# Patient Record
Sex: Female | Born: 1935 | Race: White | Hispanic: No | Marital: Married | Smoking: Former smoker
Health system: Southern US, Community
[De-identification: ages and names within clinical notes are randomized; demographics above are authoritative.]

## PROBLEM LIST (undated history)

## (undated) DIAGNOSIS — I10 Essential (primary) hypertension: Secondary | ICD-10-CM

## (undated) DIAGNOSIS — M1990 Unspecified osteoarthritis, unspecified site: Secondary | ICD-10-CM

## (undated) DIAGNOSIS — M810 Age-related osteoporosis without current pathological fracture: Secondary | ICD-10-CM

## (undated) DIAGNOSIS — I2699 Other pulmonary embolism without acute cor pulmonale: Secondary | ICD-10-CM

## (undated) DIAGNOSIS — H3530 Unspecified macular degeneration: Secondary | ICD-10-CM

## (undated) DIAGNOSIS — I517 Cardiomegaly: Secondary | ICD-10-CM

## (undated) DIAGNOSIS — E7800 Pure hypercholesterolemia, unspecified: Secondary | ICD-10-CM

## (undated) HISTORY — DX: Age-related osteoporosis without current pathological fracture: M81.0

## (undated) HISTORY — DX: Other pulmonary embolism without acute cor pulmonale: I26.99

## (undated) HISTORY — PX: VEIN LIGATION: SHX2652

## (undated) HISTORY — DX: Unspecified macular degeneration: H35.30

## (undated) HISTORY — DX: Pure hypercholesterolemia, unspecified: E78.00

## (undated) HISTORY — DX: Unspecified osteoarthritis, unspecified site: M19.90

## (undated) HISTORY — DX: Cardiomegaly: I51.7

## (undated) HISTORY — DX: Essential (primary) hypertension: I10

## (undated) HISTORY — PX: OTHER SURGICAL HISTORY: SHX169

## (undated) HISTORY — PX: REPLACEMENT TOTAL KNEE: SUR1224

---

## 1976-07-27 HISTORY — PX: VAGINAL HYSTERECTOMY: SUR661

## 2000-07-27 HISTORY — PX: OTHER SURGICAL HISTORY: SHX169

## 2001-07-27 HISTORY — PX: CHOLECYSTECTOMY: SHX55

## 2002-07-27 HISTORY — PX: HERNIA REPAIR: SHX51

## 2004-08-28 ENCOUNTER — Ambulatory Visit: Payer: Self-pay | Admitting: Internal Medicine

## 2005-05-12 ENCOUNTER — Ambulatory Visit: Payer: Self-pay | Admitting: Internal Medicine

## 2005-09-15 ENCOUNTER — Ambulatory Visit: Payer: Self-pay | Admitting: Internal Medicine

## 2006-04-14 ENCOUNTER — Ambulatory Visit: Payer: Self-pay | Admitting: Unknown Physician Specialty

## 2006-04-16 ENCOUNTER — Ambulatory Visit: Payer: Self-pay | Admitting: Internal Medicine

## 2006-04-26 ENCOUNTER — Ambulatory Visit: Payer: Self-pay | Admitting: Internal Medicine

## 2006-05-15 ENCOUNTER — Emergency Department: Payer: Self-pay | Admitting: Emergency Medicine

## 2006-05-21 ENCOUNTER — Inpatient Hospital Stay: Payer: Self-pay | Admitting: Internal Medicine

## 2006-09-30 ENCOUNTER — Ambulatory Visit: Payer: Self-pay | Admitting: Internal Medicine

## 2007-10-05 ENCOUNTER — Ambulatory Visit: Payer: Self-pay | Admitting: Internal Medicine

## 2008-07-25 ENCOUNTER — Emergency Department: Payer: Self-pay | Admitting: Emergency Medicine

## 2008-10-08 ENCOUNTER — Ambulatory Visit: Payer: Self-pay | Admitting: Internal Medicine

## 2009-09-02 ENCOUNTER — Encounter: Admission: RE | Admit: 2009-09-02 | Discharge: 2009-09-02 | Payer: Self-pay | Admitting: Internal Medicine

## 2009-10-09 ENCOUNTER — Ambulatory Visit: Payer: Self-pay | Admitting: Internal Medicine

## 2010-02-25 ENCOUNTER — Encounter: Admission: RE | Admit: 2010-02-25 | Discharge: 2010-02-25 | Payer: Self-pay | Admitting: Neurosurgery

## 2010-06-09 ENCOUNTER — Ambulatory Visit: Payer: Self-pay | Admitting: General Practice

## 2010-06-23 ENCOUNTER — Ambulatory Visit: Payer: Self-pay | Admitting: Vascular Surgery

## 2010-06-25 ENCOUNTER — Inpatient Hospital Stay: Payer: Self-pay | Admitting: General Practice

## 2010-07-01 ENCOUNTER — Encounter: Payer: Self-pay | Admitting: Internal Medicine

## 2010-07-15 ENCOUNTER — Encounter: Payer: Self-pay | Admitting: Internal Medicine

## 2010-08-26 NOTE — Letter (Signed)
Summary: [facility] Face Sheet  [facility] Face Sheet   Imported By: Beau Fanny 07/03/2010 14:50:12  _____________________________________________________________________  External Attachment:    Type:   Image     Comment:   External Document

## 2010-08-28 NOTE — Letter (Signed)
Summary: Documentation for [facility]  Documentation for [facility]   Imported By: Lanelle Bal 07/24/2010 07:39:10  _____________________________________________________________________  External Attachment:    Type:   Image     Comment:   External Document

## 2010-10-08 ENCOUNTER — Ambulatory Visit: Payer: Self-pay | Admitting: Vascular Surgery

## 2010-10-13 ENCOUNTER — Ambulatory Visit: Payer: Self-pay | Admitting: Internal Medicine

## 2010-10-26 ENCOUNTER — Ambulatory Visit: Payer: Self-pay | Admitting: Internal Medicine

## 2010-11-10 ENCOUNTER — Emergency Department: Payer: Self-pay | Admitting: Emergency Medicine

## 2010-11-13 ENCOUNTER — Ambulatory Visit: Payer: Self-pay | Admitting: Internal Medicine

## 2010-12-29 ENCOUNTER — Other Ambulatory Visit: Payer: Self-pay | Admitting: *Deleted

## 2010-12-29 DIAGNOSIS — D496 Neoplasm of unspecified behavior of brain: Secondary | ICD-10-CM

## 2011-01-02 ENCOUNTER — Ambulatory Visit
Admission: RE | Admit: 2011-01-02 | Discharge: 2011-01-02 | Disposition: A | Discharge status: Home / Self Care | Payer: Medicare Other | Source: Ambulatory Visit | Attending: *Deleted | Admitting: *Deleted

## 2011-01-02 DIAGNOSIS — D496 Neoplasm of unspecified behavior of brain: Secondary | ICD-10-CM

## 2011-01-02 MED ORDER — GADOBENATE DIMEGLUMINE 529 MG/ML IV SOLN
18.0000 mL | Freq: Once | INTRAVENOUS | Status: AC | PRN
  Administered 2011-01-02: 18 mL via INTRAVENOUS

## 2011-07-28 HISTORY — PX: CATARACT EXTRACTION: SUR2

## 2011-08-07 ENCOUNTER — Other Ambulatory Visit: Payer: Self-pay | Admitting: Neurosurgery

## 2011-08-07 DIAGNOSIS — R5381 Other malaise: Secondary | ICD-10-CM

## 2011-08-07 DIAGNOSIS — M545 Low back pain: Secondary | ICD-10-CM

## 2011-08-07 DIAGNOSIS — R5383 Other fatigue: Secondary | ICD-10-CM

## 2011-08-17 ENCOUNTER — Ambulatory Visit
Admission: RE | Admit: 2011-08-17 | Discharge: 2011-08-17 | Disposition: A | Discharge status: Home / Self Care | Payer: Medicare Other | Source: Ambulatory Visit | Attending: Neurosurgery | Admitting: Neurosurgery

## 2011-08-17 DIAGNOSIS — M545 Low back pain: Secondary | ICD-10-CM

## 2011-08-17 DIAGNOSIS — R5381 Other malaise: Secondary | ICD-10-CM

## 2011-08-17 DIAGNOSIS — R5383 Other fatigue: Secondary | ICD-10-CM

## 2011-08-17 MED ORDER — GADOBENATE DIMEGLUMINE 529 MG/ML IV SOLN
18.0000 mL | Freq: Once | INTRAVENOUS | Status: AC | PRN
  Administered 2011-08-17: 18 mL via INTRAVENOUS

## 2012-02-03 ENCOUNTER — Other Ambulatory Visit: Payer: Self-pay | Admitting: Neurosurgery

## 2012-02-03 DIAGNOSIS — M545 Low back pain: Secondary | ICD-10-CM

## 2012-02-11 ENCOUNTER — Ambulatory Visit
Admission: RE | Admit: 2012-02-11 | Discharge: 2012-02-11 | Disposition: A | Discharge status: Home / Self Care | Payer: Medicare Other | Source: Ambulatory Visit | Attending: Neurosurgery | Admitting: Neurosurgery

## 2012-02-11 DIAGNOSIS — M545 Low back pain: Secondary | ICD-10-CM

## 2012-08-08 ENCOUNTER — Other Ambulatory Visit: Payer: Self-pay | Admitting: *Deleted

## 2012-08-08 DIAGNOSIS — D1800 Hemangioma unspecified site: Secondary | ICD-10-CM

## 2012-08-08 DIAGNOSIS — M48061 Spinal stenosis, lumbar region without neurogenic claudication: Secondary | ICD-10-CM

## 2012-08-15 ENCOUNTER — Ambulatory Visit
Admission: RE | Admit: 2012-08-15 | Discharge: 2012-08-15 | Disposition: A | Discharge status: Home / Self Care | Payer: Medicare Other | Source: Ambulatory Visit | Attending: *Deleted | Admitting: *Deleted

## 2012-08-15 DIAGNOSIS — D1800 Hemangioma unspecified site: Secondary | ICD-10-CM

## 2012-08-15 DIAGNOSIS — M48061 Spinal stenosis, lumbar region without neurogenic claudication: Secondary | ICD-10-CM

## 2012-08-15 MED ORDER — GADOBENATE DIMEGLUMINE 529 MG/ML IV SOLN
17.0000 mL | Freq: Once | INTRAVENOUS | Status: AC | PRN
  Administered 2012-08-15: 17 mL via INTRAVENOUS

## 2013-05-08 DIAGNOSIS — S42009A Fracture of unspecified part of unspecified clavicle, initial encounter for closed fracture: Secondary | ICD-10-CM

## 2013-08-21 ENCOUNTER — Other Ambulatory Visit: Payer: Self-pay | Admitting: Cardiothoracic Surgery

## 2013-08-21 ENCOUNTER — Other Ambulatory Visit: Payer: Self-pay | Admitting: Physician Assistant

## 2013-08-21 DIAGNOSIS — D1800 Hemangioma unspecified site: Secondary | ICD-10-CM

## 2013-08-21 DIAGNOSIS — D181 Lymphangioma, any site: Principal | ICD-10-CM

## 2013-09-18 ENCOUNTER — Ambulatory Visit
Admission: RE | Admit: 2013-09-18 | Discharge: 2013-09-18 | Disposition: A | Discharge status: Home / Self Care | Payer: No Typology Code available for payment source | Source: Ambulatory Visit | Attending: Physician Assistant | Admitting: Physician Assistant

## 2013-09-18 DIAGNOSIS — D181 Lymphangioma, any site: Principal | ICD-10-CM

## 2013-09-18 DIAGNOSIS — D1800 Hemangioma unspecified site: Secondary | ICD-10-CM

## 2013-09-20 ENCOUNTER — Other Ambulatory Visit: Payer: Medicare Other

## 2013-09-22 ENCOUNTER — Other Ambulatory Visit: Payer: Medicare Other

## 2014-01-16 ENCOUNTER — Ambulatory Visit: Payer: Self-pay | Admitting: Internal Medicine

## 2014-03-02 ENCOUNTER — Ambulatory Visit: Payer: Self-pay | Admitting: Internal Medicine

## 2014-03-16 ENCOUNTER — Emergency Department: Payer: Self-pay | Admitting: Emergency Medicine

## 2014-04-27 ENCOUNTER — Ambulatory Visit: Payer: Self-pay | Admitting: Orthopedic Surgery

## 2014-05-08 ENCOUNTER — Ambulatory Visit: Payer: Self-pay | Admitting: Internal Medicine

## 2014-06-12 ENCOUNTER — Ambulatory Visit: Payer: Self-pay | Admitting: Specialist

## 2014-07-23 ENCOUNTER — Encounter: Payer: Self-pay | Admitting: Internal Medicine

## 2014-08-23 ENCOUNTER — Encounter: Payer: Self-pay | Admitting: Internal Medicine

## 2014-08-23 ENCOUNTER — Ambulatory Visit: Payer: Self-pay | Admitting: Specialist

## 2014-09-13 ENCOUNTER — Encounter: Payer: Self-pay | Admitting: Internal Medicine

## 2014-10-15 ENCOUNTER — Encounter: Payer: Self-pay | Admitting: Internal Medicine

## 2014-10-17 ENCOUNTER — Encounter: Payer: Self-pay | Admitting: Pulmonary Disease

## 2014-10-17 ENCOUNTER — Ambulatory Visit (INDEPENDENT_AMBULATORY_CARE_PROVIDER_SITE_OTHER): Payer: Medicare Other | Admitting: Pulmonary Disease

## 2014-10-17 VITALS — BP 136/78 | HR 80 | Ht 60.0 in | Wt 184.0 lb

## 2014-10-17 DIAGNOSIS — I2699 Other pulmonary embolism without acute cor pulmonale: Secondary | ICD-10-CM | POA: Diagnosis not present

## 2014-10-17 DIAGNOSIS — I272 Pulmonary hypertension, unspecified: Secondary | ICD-10-CM | POA: Insufficient documentation

## 2014-10-17 DIAGNOSIS — G4733 Obstructive sleep apnea (adult) (pediatric): Secondary | ICD-10-CM | POA: Insufficient documentation

## 2014-10-17 DIAGNOSIS — I270 Primary pulmonary hypertension: Secondary | ICD-10-CM

## 2014-10-17 NOTE — Assessment & Plan Note (Signed)
We will obtain records of her recent polysomnogram. Continue CPAP.

## 2014-10-17 NOTE — Assessment & Plan Note (Signed)
She believes that she has had more than 1 thromboembolic event. She is on lifelong warfarin which is very appropriate.

## 2014-10-17 NOTE — Progress Notes (Signed)
Subjective:    Patient ID: Debra Cantu, female    DOB: 1927/10/22, 81 y.o.   MRN: [id number]  HPI Chief Complaint  Patient presents with  . Advice Only    Pt has been seeing Dr. Raul Del and Dr Ubaldo Glassing after a cxr showed an enlarged heart on 02/2014.  HX of PE's.    Debra Cantu is here to see me today for a second opinion for her pulmonary hypertension.  She has a history of obstructive sleep apnea and pulmonary hypertension and is here to see me for the same.  She has had pulmonary emboli in the past (2002 and 2007) and has been on anticoagulation for quite some time. She developed a blood clot in her right arm in while on warfarin in 2015 when she had to have her arm in traction for a shoulder fracture. (see below).  Her sister had blood clots as well.   She had two separate pulmonary emboli in the 2000's. The first was after surgery in [facility] and she developed a pulmonary embolism which she said affected her breathing for over a year.  She had another blood clot in her leg in roughly 2007 (she thinks).  She had an IVC temporarily around a total knee replacement which was successfully removed.   She broke her shoulder in 03/2013 and in 03/2014 she had a "compression fracture" of the same arm which has left her with a lot of pain in the veins and nerves in her arm.  She says she has had a lot of therapy for it, but an X-ray to evaluate her fracture she was found to have an enlarged heart.  This lead to an echocardiogram which showed elevated PA pressure.  She says that she has noticed increased fatigue in the last few years and low energy.  She has been struggling to exercise as much as she thinks she is capable in the last several years.  She says that she will occasionally feel dyspneic with exertion with activities like going to the grocery store or walking as much as a mile.  She says this has improved somewhat with treatment for her sleep apnea.  She thinks that having her sleep apnea diagnosed  in November 2015 and was started on CPAP at that time.  She says that this has really helped.   Currently she doesn't feel too much dyspnea.  She says she could not walk a mile or complete a trip to the grocery store without pacing herself. She does walk 1/2 mile three times per week without stopping, but this makes her dyspneic.   Incidentally she has had a number of falls.  She has macular degeneration.  She notes that she falls when she turns quickly or moves too fast.  She has been found to have pulmonary hypertension on an echocardiogram.    Past Medical History  Diagnosis Date  . Enlarged heart   . Pulmonary embolism   . Hypercholesteremia   . Hypertension   . Osteoporosis   . Macular degeneration   . Arthritis      Family History  Problem Relation Age of Onset  . Heart disease Brother   . Stroke Father   . Alzheimer's disease Mother   . Cancer Sister     skin     History   Social History  . Marital Status: Married    Spouse Name: N/A  . Number of Children: N/A  . Years of Education: N/A   Occupational History  .  Not on file.   Social History Main Topics  . Smoking status: Former Smoker -- 0.10 packs/day for 1 years    Types: Cigarettes    Quit date: 10/17/1954  . Smokeless tobacco: Never Used  . Alcohol Use: Not on file  . Drug Use: Not on file  . Sexual Activity: Not on file   Other Topics Concern  . Not on file   Social History Narrative  . No narrative on file     Allergies  Allergen Reactions  . Oxycodone      No outpatient prescriptions prior to visit.   No facility-administered medications prior to visit.      Review of Systems  Constitutional: Positive for fatigue. Negative for fever and unexpected weight change.  HENT: Negative for congestion, dental problem, ear pain, nosebleeds, postnasal drip, rhinorrhea, sinus pressure, sneezing, sore throat and trouble swallowing.   Eyes: Negative for redness and itching.  Respiratory:  Positive for shortness of breath. Negative for cough, chest tightness and wheezing.   Cardiovascular: Negative for palpitations and leg swelling.  Gastrointestinal: Negative for nausea and vomiting.  Genitourinary: Negative for dysuria.  Musculoskeletal: Negative for joint swelling.  Skin: Negative for rash.  Neurological: Negative for headaches.  Hematological: Does not bruise/bleed easily.  Psychiatric/Behavioral: Negative for dysphoric mood. The patient is not nervous/anxious.        Objective:   Physical Exam Filed Vitals:   10/17/14 1100  BP: 136/78  Pulse: 80  Height: 5' (1.524 m)  Weight: 184 lb (83.462 kg)  SpO2: 95%   RA  Gen: well appearing, no acute distress HEENT: NCAT, PERRL, EOMi, OP clear, neck supple without masses PULM: CTA B CV: RRR, loud P2, no JVD AB: BS+, soft, nontender, no hsm Ext: warm, significant leg edema, no clubbing, no cyanosis Derm: no rash or skin breakdown Neuro: A&Ox4, CN II-XII intact, strength 5/5 in all 4 extremities  Cardiology notes reviewed Primary care Echocardiogram from 2016 reviewed, normal RV size and function, PA estimate 62 mmHg, normal LV size and function      Assessment & Plan:   Pulmonary hypertension This is a very pleasant lady in her mid 81s who has pulmonary hypertension seen on echocardiogram who has been referred to me for evaluation of this same. There is an increasing incidence of pulmonary hypertension diagnosed in patient's her demographic but it is not always year who should be treated. In fact most people probably do not need to be treated with pulmonary vasodilators in the setting.  In her particular case she is at a minimum a WHO grade 3 considering her obstructive sleep apnea. That said, she has an extensive history of hypercoagulable state and has had at least one pulmonary embolism, though she tells me she thinks she has had 2 thromboembolic events. She had an IVC filter which has been removed.  It  should be noted that this point she really does not have a diagnosis of pulmonary hypertension as she has not had a right heart catheterization. If we were to diagnose pulmonary hypertension is not clear to me that she would benefit from pulmonary vasodilators. The only situation in which I would consider treating will be if she had chronic thromboembolic disease .  Even in that case, I'm not sure that she would benefit from a symptom standpoint because of her age, and multiple comorbid illnesses.  Plan: -I will attempt to discuss with her primary care physician and cardiologist whether or not they think it is worthwhile  to proceed with a further workup -If we decide to move forward, then I would perform a VQ scan to look for chronic thromboembolic disease. -If the VQ scan is negative, I don't think I would do anything else as we would be operating through the assumption that her pulmonary hypertension is related to her obstructive sleep apnea -If the VQ scan does show evidence) thromboembolic disease, then it would be reasonable to consider a right heart catheterization performed on Coumadin.    Acute pulmonary embolism She believes that she has had more than 1 thromboembolic event. She is on lifelong warfarin which is very appropriate.   Obstructive sleep apnea We will obtain records of her recent polysomnogram. Continue CPAP.     Updated Medication List Outpatient Encounter Prescriptions as of 10/17/2014  Medication Sig  . acetaminophen (TYLENOL) 325 MG tablet Take 650 mg by mouth every 6 (six) hours as needed.  Marland Kitchen amLODipine (NORVASC) 5 MG tablet Take 2.5 mg by mouth daily.  Marland Kitchen atorvastatin (LIPITOR) 80 MG tablet Take 80 mg by mouth daily.  . beta carotene 15 MG capsule Take 15 mg by mouth daily.  . carboxymethylcellulose (REFRESH PLUS) 0.5 % SOLN 1 drop 2 (two) times daily as needed.  . cholecalciferol (VITAMIN D) 400 UNITS TABS tablet Take 400 Units by mouth daily.  Marland Kitchen docusate sodium  (COLACE) 100 MG capsule Take 100 mg by mouth 2 (two) times daily as needed for mild constipation.  Marland Kitchen glucosamine-chondroitin 500-400 MG tablet Take 1 tablet by mouth daily.  Marland Kitchen levothyroxine (SYNTHROID, LEVOTHROID) 88 MCG tablet Take 88 mcg by mouth daily before breakfast.  . Multiple Vitamins-Minerals (PRESERVISION AREDS 2 PO) Take 1 capsule by mouth daily.  . polyethylene glycol (MIRALAX / GLYCOLAX) packet Take 17 g by mouth daily.  . valsartan (DIOVAN) 320 MG tablet Take 320 mg by mouth daily.  Marland Kitchen warfarin (COUMADIN) 4 MG tablet Take 4 mg by mouth daily.

## 2014-10-17 NOTE — Patient Instructions (Signed)
We will arrange a nuclear test called a V/Q scan if Dr. Caryl Comes agrees We will see you back in 6-8 weeks or sooner if needed

## 2014-10-17 NOTE — Assessment & Plan Note (Signed)
This is a very pleasant lady in her mid 60s who has pulmonary hypertension seen on echocardiogram who has been referred to me for evaluation of this same. There is an increasing incidence of pulmonary hypertension diagnosed in patient's her demographic but it is not always year who should be treated. In fact most people probably do not need to be treated with pulmonary vasodilators in the setting.  In her particular case she is at a minimum a WHO grade 3 considering her obstructive sleep apnea. That said, she has an extensive history of hypercoagulable state and has had at least one pulmonary embolism, though she tells me she thinks she has had 2 thromboembolic events. She had an IVC filter which has been removed.  It should be noted that this point she really does not have a diagnosis of pulmonary hypertension as she has not had a right heart catheterization. If we were to diagnose pulmonary hypertension is not clear to me that she would benefit from pulmonary vasodilators. The only situation in which I would consider treating will be if she had chronic thromboembolic disease .  Even in that case, I'm not sure that she would benefit from a symptom standpoint because of her age, and multiple comorbid illnesses.  Plan: -I will attempt to discuss with her primary care physician and cardiologist whether or not they think it is worthwhile to proceed with a further workup -If we decide to move forward, then I would perform a VQ scan to look for chronic thromboembolic disease. -If the VQ scan is negative, I don't think I would do anything else as we would be operating through the assumption that her pulmonary hypertension is related to her obstructive sleep apnea -If the VQ scan does show evidence) thromboembolic disease, then it would be reasonable to consider a right heart catheterization performed on Coumadin.

## 2014-10-18 ENCOUNTER — Telehealth: Payer: Self-pay

## 2014-10-18 DIAGNOSIS — I272 Pulmonary hypertension, unspecified: Secondary | ICD-10-CM

## 2014-10-18 NOTE — Telephone Encounter (Signed)
Pt aware of recs.  vq scan ordered.  Nothing further needed.

## 2014-10-18 NOTE — Telephone Encounter (Signed)
-----   Message from Juanito Doom, MD sent at 10/17/2014  2:25 PM EDT ----- Hi,  Please order a V/Q scan for her. [place].  Reason: pulmonary hypertension, evaluate for chronic thromboembolic disease.  Thanks [employer]

## 2014-10-22 NOTE — Addendum Note (Signed)
Addended by: Maurice March on: 10/22/2014 12:12 PM   Modules accepted: Orders

## 2014-10-22 NOTE — Telephone Encounter (Signed)
CXR order as it is routine to have CXR within 24 hours of VQ scan. Order placed. [name], [facility], made aware to inform pt. Nothing further needed.

## 2014-10-23 ENCOUNTER — Ambulatory Visit: Admit: 2014-10-23 | Disposition: A | Discharge status: Home / Self Care | Payer: Self-pay | Admitting: Pulmonary Disease

## 2014-10-26 ENCOUNTER — Encounter (INDEPENDENT_AMBULATORY_CARE_PROVIDER_SITE_OTHER): Payer: Self-pay

## 2014-11-01 ENCOUNTER — Telehealth: Payer: Self-pay

## 2014-11-01 NOTE — Telephone Encounter (Signed)
-----   Message from Juanito Doom, MD sent at 11/01/2014  7:49 AM EDT ----- A, Please let her know that the V/Q scan did not show anything worrisome.  I don't recommend a different plan other than what we discussed in clinic. Thanks B

## 2014-11-01 NOTE — Telephone Encounter (Signed)
A,  Please let her know that her CXR was OK  Thanks  B  -------------- Pt aware of all results and recs.  Nothing further needed.

## 2014-11-21 ENCOUNTER — Ambulatory Visit
Admit: 2014-11-21 | Disposition: A | Discharge status: Home / Self Care | Payer: Self-pay | Attending: Physician Assistant | Admitting: Physician Assistant

## 2014-12-07 ENCOUNTER — Encounter: Payer: Self-pay | Admitting: Pulmonary Disease

## 2014-12-07 ENCOUNTER — Ambulatory Visit (INDEPENDENT_AMBULATORY_CARE_PROVIDER_SITE_OTHER)
Admission: RE | Admit: 2014-12-07 | Discharge: 2014-12-07 | Disposition: A | Discharge status: Home / Self Care | Payer: Medicare Other | Source: Ambulatory Visit | Attending: Pulmonary Disease | Admitting: Pulmonary Disease

## 2014-12-07 ENCOUNTER — Ambulatory Visit (INDEPENDENT_AMBULATORY_CARE_PROVIDER_SITE_OTHER): Payer: Medicare Other | Admitting: Pulmonary Disease

## 2014-12-07 ENCOUNTER — Ambulatory Visit (INDEPENDENT_AMBULATORY_CARE_PROVIDER_SITE_OTHER): Payer: Medicare Other | Admitting: *Deleted

## 2014-12-07 VITALS — BP 136/82 | HR 76 | Temp 97.9°F | Ht 60.0 in | Wt 182.0 lb

## 2014-12-07 DIAGNOSIS — R0602 Shortness of breath: Secondary | ICD-10-CM | POA: Diagnosis not present

## 2014-12-07 DIAGNOSIS — Z20828 Contact with and (suspected) exposure to other viral communicable diseases: Secondary | ICD-10-CM | POA: Diagnosis not present

## 2014-12-07 DIAGNOSIS — J069 Acute upper respiratory infection, unspecified: Secondary | ICD-10-CM | POA: Diagnosis not present

## 2014-12-07 DIAGNOSIS — R6883 Chills (without fever): Secondary | ICD-10-CM | POA: Diagnosis not present

## 2014-12-07 LAB — POCT INFLUENZA A/B
INFLUENZA A, POC: NEGATIVE
Influenza B, POC: NEGATIVE

## 2014-12-07 NOTE — Progress Notes (Signed)
Subjective:    Patient ID: Debra Cantu, female    DOB: 12/22/1927, 81 y.o.   MRN: [id number]  HPI  Chief Complaint  Patient presents with  . Follow-up    pt believes she has the flu.  C/o aches and pains, sob, nonprod cough, chills since yesterday.  Pt was on a flight and exposed to flu.    Arayah has been doing fairly well up until yesterday. No new problems with worsening shortness of breath. She has remained compliant with her CPAP therapy.  Next line however she went for a trip to visit family in [place] and both her and her daughter became ill with chills, myalgias, cough productive of sputum, and some sinus congestion. She says that she thinks she may have been exposed to someone with flu while she was on the plane. Apparently her daughter was seen by her physician yesterday and was told that she had a viral illness but not influenza. She has not taken any treatment for this.  She said that recently her CPAP mask was changed and so it's been difficult for her to use the new one.  Past Medical History  Diagnosis Date  . Enlarged heart   . Pulmonary embolism   . Hypercholesteremia   . Hypertension   . Osteoporosis   . Macular degeneration   . Arthritis       Review of Systems  Constitutional: Positive for fever and chills. Negative for fatigue.  HENT: Positive for postnasal drip, rhinorrhea and sinus pressure.   Respiratory: Positive for cough. Negative for shortness of breath and wheezing.   Cardiovascular: Negative for chest pain, palpitations and leg swelling.       Objective:   Physical Exam Filed Vitals:   12/07/14 1159  BP: 136/82  Pulse: 76  Temp: 97.9 F (36.6 C)  TempSrc: Oral  Height: 5' (1.524 m)  Weight: 182 lb (82.555 kg)  SpO2: 94%   Room air  Gen: Overweight, no acute distress HENT: OP clear, TM's clear, neck supple PULMFew crackles left upper lobe that cleared, otherwise clear to auscultation, normal percussion CV: RRR,Loud P2, no JVD,  trace edema GI: BS+, soft, nontender Derm: no cyanosis or rash Psyche: normal mood and affect   I have personally reviewed the images from the nuclear medicine study from last month and there is no evidence of blood clot   Primary care note from last month reviewed where she was seen for right lower quadrant pain and had a CT scan which showed nephrolithiasis     Assessment & Plan:    Pulmonary hypertension She has evidence of pulmonary hypertension on an echocardiogram but she has not undergone a right heart catheterization. She had a VQ scan last month, I have reviewed those images and there is no evidence of chronic thromboembolic disease.  She has WHO group 3 disease therefore there is no role for a pulmonary vasodilator. The best way to treat her pulmonary hypertension is by remaining compliant with CPAP.  Plan: Continue CPAP indefinitely Follow-up again in 6 months, if her shortness of breath has worsened then we may consider a right heart catheterization   URI (upper respiratory infection) She has a cough, scant mucus production, chills, body aches, and headache. This is in keeping with a viral illness. Her daughter was tested for flu yesterday and her test was negative.   I'm encouraged by the fact that her vital signs are normal and she is breathing comfortably at this time.  Plan:  Flu test Chest x-ray to ensure there is no evidence of pneumonia Supportive therapy, over-the-counter medications encouraged If worsening symptoms come back or go to urgent care

## 2014-12-07 NOTE — Assessment & Plan Note (Signed)
She has a cough, scant mucus production, chills, body aches, and headache. This is in keeping with a viral illness. Her daughter was tested for flu yesterday and her test was negative.   I'm encouraged by the fact that her vital signs are normal and she is breathing comfortably at this time.  Plan: Flu test Chest x-ray to ensure there is no evidence of pneumonia Supportive therapy, over-the-counter medications encouraged If worsening symptoms come back or go to urgent care

## 2014-12-07 NOTE — Patient Instructions (Signed)
User CPAP machine every night We will call you Thursday also the flu test and the chest x-ray Drink plenty of fluids We will see you back in 6 months or sooner if needed

## 2014-12-07 NOTE — Assessment & Plan Note (Signed)
She has evidence of pulmonary hypertension on an echocardiogram but she has not undergone a right heart catheterization. She had a VQ scan last month, I have reviewed those images and there is no evidence of chronic thromboembolic disease.  She has WHO group 3 disease therefore there is no role for a pulmonary vasodilator. The best way to treat her pulmonary hypertension is by remaining compliant with CPAP.  Plan: Continue CPAP indefinitely Follow-up again in 6 months, if her shortness of breath has worsened then we may consider a right heart catheterization

## 2014-12-17 ENCOUNTER — Encounter: Payer: Self-pay | Admitting: Pulmonary Disease

## 2014-12-21 ENCOUNTER — Encounter: Payer: Self-pay | Admitting: Internal Medicine

## 2015-02-15 ENCOUNTER — Telehealth: Payer: Self-pay | Admitting: Pulmonary Disease

## 2015-02-15 NOTE — Telephone Encounter (Signed)
ATC FAST BUSY SIGNAL WCB

## 2015-02-18 NOTE — Telephone Encounter (Signed)
Spoke with patient , states that she will be calling us back later this week after she speaks with her DME one more time about the provider she is looking into switching to in [place]. Patient states that she is aware that the only MD that is certified to do sleep medicine is Dr Ashby Dawes. Patient states that she will call us back to make the final switch later this week.  Will close this message in the meantime as nothing is needed at this time.

## 2015-02-18 NOTE — Telephone Encounter (Signed)
[phone], pt cb

## 2015-02-18 NOTE — Telephone Encounter (Signed)
lmtcb

## 2015-03-18 ENCOUNTER — Ambulatory Visit (INDEPENDENT_AMBULATORY_CARE_PROVIDER_SITE_OTHER): Payer: Medicare Other | Admitting: Internal Medicine

## 2015-03-18 ENCOUNTER — Encounter: Payer: Self-pay | Admitting: Internal Medicine

## 2015-03-18 ENCOUNTER — Other Ambulatory Visit
Admission: RE | Admit: 2015-03-18 | Discharge: 2015-03-18 | Disposition: A | Discharge status: Home / Self Care | Payer: Medicare Other | Source: Ambulatory Visit | Attending: Internal Medicine | Admitting: Internal Medicine

## 2015-03-18 VITALS — BP 142/90 | HR 72 | Ht 60.0 in | Wt 186.0 lb

## 2015-03-18 DIAGNOSIS — I5032 Chronic diastolic (congestive) heart failure: Secondary | ICD-10-CM

## 2015-03-18 LAB — BASIC METABOLIC PANEL
ANION GAP: 6 (ref 5–15)
BUN: 21 mg/dL — AB (ref 6–20)
CHLORIDE: 105 mmol/L (ref 101–111)
CO2: 29 mmol/L (ref 22–32)
Calcium: 10.1 mg/dL (ref 8.9–10.3)
Creatinine, Ser: 0.68 mg/dL (ref 0.44–1.00)
GFR calc Af Amer: >60 mL/min (ref >60)
GFR calc non Af Amer: >60 mL/min (ref >60)
GLUCOSE: 98 mg/dL (ref 65–99)
POTASSIUM: 4.2 mmol/L (ref 3.5–5.1)
Sodium: 140 mmol/L (ref 135–145)

## 2015-03-18 MED ORDER — FUROSEMIDE 20 MG PO TABS: 20.0000 mg | ORAL_TABLET | Freq: Every day | ORAL | Status: DC

## 2015-03-18 MED ORDER — CLONAZEPAM 0.25 MG PO TBDP: 0.2500 mg | ORAL_TABLET | Freq: Every day | ORAL | Status: DC

## 2015-03-18 NOTE — Progress Notes (Signed)
Date: 03/18/2015,   MRN# [id number] Debra Cantu Dec 14, 1927 Code Status:  Hosp day:@LENGTHOFSTAYDAYS @ Referring MD: @ATDPROV @     PCP:      AdmissionWeight: 186 lb (84.369 kg)                 CurrentWeight: 186 lb (84.369 kg) Debra Cantu is a 81 y.o. old female seen in consultation for SOba dn Sleep apnea     CHIEF COMPLAINT:   Follow up SOB   HISTORY OF PRESENT ILLNESS   81 yo pleasant white female seen today for follow up fro SOB and sleep apnea. Bigest complaint today is trouble sleeping while on CPAP Patient has been diagnosed and beiing treated for sleep apnea.   Patient states that she has chronic SOB and has affected her physical; well being, she states that she has gained 13 pounds over last several months and has developed lower ext swelling.  Patient has no acute resp issues, no evidence of infection at this time   PAST MEDICAL HISTORY   Past Medical History  Diagnosis Date  . Enlarged heart   . Pulmonary embolism   . Hypercholesteremia   . Hypertension   . Osteoporosis   . Macular degeneration   . Arthritis      SURGICAL HISTORY   Past Surgical History  Procedure Laterality Date  . Vein ligation Bilateral   . Vaginal hysterectomy  1978  . Ruptured disc repair      T4, T5  . Cholecystectomy  2003  . Reconstructive foot surgery  2002  . Hernia repair  2004  . Replacement total knee Right   . Cataract extraction  2013     FAMILY HISTORY   Family History  Problem Relation Age of Onset  . Heart disease Brother   . Stroke Father   . Alzheimer's disease Mother   . Cancer Sister     skin     SOCIAL HISTORY   Social History  Substance Use Topics  . Smoking status: Former Smoker -- 0.10 packs/day for 1 years    Types: Cigarettes    Quit date: 10/17/1954  . Smokeless tobacco: Never Used  . Alcohol Use: None     MEDICATIONS    Home Medication:  Current Outpatient Rx  Name  Route  Sig  Dispense  Refill  . acetaminophen (TYLENOL)  325 MG tablet   Oral   Take 650 mg by mouth every 6 (six) hours as needed.         Marland Kitchen amLODipine (NORVASC) 5 MG tablet   Oral   Take 2.5 mg by mouth daily.         Marland Kitchen atorvastatin (LIPITOR) 80 MG tablet   Oral   Take 80 mg by mouth daily.         . beta carotene 15 MG capsule   Oral   Take 15 mg by mouth daily.         . carboxymethylcellulose (REFRESH PLUS) 0.5 % SOLN      1 drop 2 (two) times daily as needed.         . cholecalciferol (VITAMIN D) 400 UNITS TABS tablet   Oral   Take 400 Units by mouth daily.         Marland Kitchen glucosamine-chondroitin 500-400 MG tablet   Oral   Take 1 tablet by mouth daily.         Marland Kitchen levothyroxine (SYNTHROID, LEVOTHROID) 88 MCG tablet   Oral   Take 88 mcg by  mouth daily before breakfast.         . Multiple Vitamins-Minerals (PRESERVISION AREDS 2 PO)   Oral   Take 1 capsule by mouth daily.         . polyethylene glycol (MIRALAX / GLYCOLAX) packet   Oral   Take 17 g by mouth daily.         . valsartan (DIOVAN) 320 MG tablet   Oral   Take 320 mg by mouth daily.         Marland Kitchen warfarin (COUMADIN) 4 MG tablet   Oral   Take 3.5 mg by mouth daily.            Current Medication:  Current outpatient prescriptions:  .  acetaminophen (TYLENOL) 325 MG tablet, Take 650 mg by mouth every 6 (six) hours as needed., Disp: , Rfl:  .  amLODipine (NORVASC) 5 MG tablet, Take 2.5 mg by mouth daily., Disp: , Rfl:  .  atorvastatin (LIPITOR) 80 MG tablet, Take 80 mg by mouth daily., Disp: , Rfl:  .  beta carotene 15 MG capsule, Take 15 mg by mouth daily., Disp: , Rfl:  .  carboxymethylcellulose (REFRESH PLUS) 0.5 % SOLN, 1 drop 2 (two) times daily as needed., Disp: , Rfl:  .  cholecalciferol (VITAMIN D) 400 UNITS TABS tablet, Take 400 Units by mouth daily., Disp: , Rfl:  .  glucosamine-chondroitin 500-400 MG tablet, Take 1 tablet by mouth daily., Disp: , Rfl:  .  levothyroxine (SYNTHROID, LEVOTHROID) 88 MCG tablet, Take 88 mcg by mouth daily  before breakfast., Disp: , Rfl:  .  Multiple Vitamins-Minerals (PRESERVISION AREDS 2 PO), Take 1 capsule by mouth daily., Disp: , Rfl:  .  polyethylene glycol (MIRALAX / GLYCOLAX) packet, Take 17 g by mouth daily., Disp: , Rfl:  .  valsartan (DIOVAN) 320 MG tablet, Take 320 mg by mouth daily., Disp: , Rfl:  .  warfarin (COUMADIN) 4 MG tablet, Take 3.5 mg by mouth daily. , Disp: , Rfl:     ALLERGIES   Oxycodone     REVIEW OF SYSTEMS   Review of Systems  Constitutional: Negative for fever, chills and weight loss.  Eyes: Negative for blurred vision.  Respiratory: Positive for shortness of breath. Negative for wheezing.   Cardiovascular: Positive for orthopnea and leg swelling. Negative for chest pain.  Gastrointestinal: Negative for nausea, vomiting and abdominal pain.  Genitourinary: Negative for flank pain.  Musculoskeletal: Negative.   Skin: Negative for rash.  Neurological: Negative for dizziness, tingling, tremors and headaches.  Endo/Heme/Allergies: Negative.   Psychiatric/Behavioral: Negative.   All other systems reviewed and are negative.    VS: BP 142/90 mmHg  Pulse 72  Ht 5' (1.524 m)  Wt 186 lb (84.369 kg)  BMI 36.33 kg/m2  SpO2 97%     PHYSICAL EXAM  Physical Exam  Constitutional: She is oriented to person, place, and time. She appears well-developed and well-nourished. No distress.  HENT:  Head: Normocephalic and atraumatic.  Mouth/Throat: No oropharyngeal exudate.  Eyes: EOM are normal. Pupils are equal, round, and reactive to light. No scleral icterus.  Neck: Normal range of motion. Neck supple.  Cardiovascular: Normal rate, regular rhythm and normal heart sounds.   No murmur heard. Pulmonary/Chest: No stridor. No respiratory distress. She has no wheezes.  Abdominal: Soft. Bowel sounds are normal. She exhibits no distension. There is no tenderness. There is no rebound.  Musculoskeletal: Normal range of motion. She exhibits edema.  Neurological: She  is alert and  oriented to person, place, and time. She displays normal reflexes. Coordination normal.  Skin: Skin is warm. She is not diaphoretic.  Psychiatric: She has a normal mood and affect.           ASSESSMENT/PLAN    81 yo white female with presumed Pulmonary HTN with h/o PE along with signs and symptoms of Diastolic heart failure   Pulmonary hypertension She has evidence of pulmonary hypertension on an echocardiogram but she has not undergone a right heart catheterization.  -continue CPAP as presribed-Follow up with Dr Ashby Dawes to assess her OSA.  -continue oxygen therapy   Diatsolic Heart failure -lasix 20 mg daily added to regimen -check BMP  Anxiety -Klonipin added to regimen as well    I have personally obtained a history, examined the patient, evaluated laboratory and independently reviewed imaging results, formulated the assessment and plan and placed orders.  The Patient requires high complexity decision making for assessment and support, frequent evaluation and titration of therapies, application of advanced monitoring technologies and extensive interpretation of multiple databases. Time spent with patient 45 minutes.  Patient is satisfied with Plan of action and management.    [name], M.D.  [facility]  Medical Director [place] Director [facility] Cardio-Pulmonary Department

## 2015-03-18 NOTE — Patient Instructions (Signed)
Sleep Apnea  Sleep apnea is a sleep disorder characterized by abnormal pauses in breathing while you sleep. When your breathing pauses, the level of oxygen in your blood decreases. This causes you to move out of deep sleep and into light sleep. As a result, your quality of sleep is poor, and the system that carries your blood throughout your body (cardiovascular system) experiences stress. If sleep apnea remains untreated, the following conditions can develop:  High blood pressure (hypertension).  Coronary artery disease.  Inability to achieve or maintain an erection (impotence).  Impairment of your thought process (cognitive dysfunction). There are three types of sleep apnea: 1. Obstructive sleep apnea--Pauses in breathing during sleep because of a blocked airway. 2. Central sleep apnea--Pauses in breathing during sleep because the area of the brain that controls your breathing does not send the correct signals to the muscles that control breathing. 3. Mixed sleep apnea--A combination of both obstructive and central sleep apnea. RISK FACTORS The following risk factors can increase your risk of developing sleep apnea:  Being overweight.  Smoking.  Having narrow passages in your nose and throat.  Being of older age.  Being female.  Alcohol use.  Sedative and tranquilizer use.  Ethnicity. Among individuals younger than 35 years, African Americans are at increased risk of sleep apnea. SYMPTOMS   Difficulty staying asleep.  Daytime sleepiness and fatigue.  Loss of energy.  Irritability.  Loud, heavy snoring.  Morning headaches.  Trouble concentrating.  Forgetfulness.  Decreased interest in sex. DIAGNOSIS  In order to diagnose sleep apnea, your caregiver will perform a physical examination. Your caregiver may suggest that you take a home sleep test. Your caregiver may also recommend that you spend the night in a sleep lab. In the sleep lab, several monitors record  information about your heart, lungs, and brain while you sleep. Your leg and arm movements and blood oxygen level are also recorded. TREATMENT The following actions may help to resolve mild sleep apnea:  Sleeping on your side.   Using a decongestant if you have nasal congestion.   Avoiding the use of depressants, including alcohol, sedatives, and narcotics.   Losing weight and modifying your diet if you are overweight. There also are devices and treatments to help open your airway:  Oral appliances. These are custom-made mouthpieces that shift your lower jaw forward and slightly open your bite. This opens your airway.  Devices that create positive airway pressure. This positive pressure "splints" your airway open to help you breathe better during sleep. The following devices create positive airway pressure:  Continuous positive airway pressure (CPAP) device. The CPAP device creates a continuous level of air pressure with an air pump. The air is delivered to your airway through a mask while you sleep. This continuous pressure keeps your airway open.  Nasal expiratory positive airway pressure (EPAP) device. The EPAP device creates positive air pressure as you exhale. The device consists of single-use valves, which are inserted into each nostril and held in place by adhesive. The valves create very little resistance when you inhale but create much more resistance when you exhale. That increased resistance creates the positive airway pressure. This positive pressure while you exhale keeps your airway open, making it easier to breath when you inhale again.  Bilevel positive airway pressure (BPAP) device. The BPAP device is used mainly in patients with central sleep apnea. This device is similar to the CPAP device because it also uses an air pump to deliver continuous air pressure   through a mask. However, with the BPAP machine, the pressure is set at two different levels. The pressure when you  exhale is lower than the pressure when you inhale.  Surgery. Typically, surgery is only done if you cannot comply with less invasive treatments or if the less invasive treatments do not improve your condition. Surgery involves removing excess tissue in your airway to create a wider passage way. Document Released: 07/03/2002 Document Revised: 11/07/2012 Document Reviewed: 11/19/2011 ExitCare Patient Information 2015 [employer]. This information is not intended to replace advice given to you by your health care provider. Make sure you discuss any questions you have with your health care provider.  

## 2015-04-09 ENCOUNTER — Ambulatory Visit (INDEPENDENT_AMBULATORY_CARE_PROVIDER_SITE_OTHER): Payer: Medicare Other | Admitting: Internal Medicine

## 2015-04-09 ENCOUNTER — Telehealth: Payer: Self-pay | Admitting: Internal Medicine

## 2015-04-09 ENCOUNTER — Encounter: Payer: Self-pay | Admitting: Internal Medicine

## 2015-04-09 VITALS — BP 134/88 | HR 85 | Ht 60.0 in | Wt 183.0 lb

## 2015-04-09 DIAGNOSIS — F411 Generalized anxiety disorder: Secondary | ICD-10-CM

## 2015-04-09 MED ORDER — CLONAZEPAM 0.5 MG PO TBDP: 0.5000 mg | ORAL_TABLET | Freq: Every day | ORAL | Status: DC

## 2015-04-09 NOTE — Patient Instructions (Signed)
Pulmonary Hypertension  Pulmonary hypertension is high blood pressure within the arteries in your lungs (pulmonary arteries). It is different than having high blood pressure elsewhere in your body, such as blood pressure that is measured with a blood pressure cuff. Pulmonary hypertension makes it harder for blood to flow through the lungs. As a result, the heart must work harder to pump blood through the lungs, and it may be harder for you to breathe. Over time, this can weaken the heart muscle. Pulmonary hypertension is a serious condition and can be fatal.   CAUSES  Many different medical conditions can cause pulmonary hypertension. They can be grouped into five different categories:   Group 1--Pulmonary hypertension caused by abnormal growth of small blood vessels in the lungs (pulmonary arterial hypertension). The abnormal blood vessel growth may have no known cause or may be:   · Hereditary.  · Caused by another disease such as a connective tissue disease (including lupus or scleroderma) or HIV.  · Caused by certain drugs or toxins.  Group 2--Pulmonary hypertension caused by weakness of the main chamber of the heart (left ventricle) or heart valve disease.  Group 3--Pulmonary hypertension caused by lung disease or low oxygen levels. Causes in this group include:  · Emphysema or chronic obstructive pulmonary disease (COPD).   · Untreated sleep apnea.  · Pulmonary fibrosis.  Group 4--Pulmonary hypertension caused by blood clots in the lungs (pulmonary emboli).   Group 5--Other causes of pulmonary hypertension, such as sickle cell anemia, or a mix between multiple causes.   SIGNS AND SYMPTOMS  · Shortness of breath. You may notice shortness of breath with:  ¨ Activity such as walking.  ¨ No activity.  · Tiredness and fatigue.  · Dizziness or fainting.  · Rapid heartbeat or feeling the heart flutter or skip a beat (palpitations).  · Neck vein enlargement.  · Bluish color to the lips and fingertips.  DIAGNOSIS      Various tests may be used to help diagnose pulmonary hypertension. These can include:  · Chest X-ray.  · Arterial blood gases. This test checks the oxygen level in your blood.  · CT scans. This test can provide detailed images of your lungs.  · Pulmonary function test. This test measures how much air your lungs can hold. It also tests how well air moves in and out of your lungs.  · Electrocardiography. This test traces the electrical activity of your heart.  · Echocardiography. This test is used to look at your heart in motion and how it functions.  · Heart catheterization. This test can measure the pressure in your pulmonary artery and the right side of the heart.  · Lung biopsy. A tissue sample is sometimes taken from the lung to check for underlying causes.  TREATMENT  Pulmonary hypertension has no cure. Treatment is to help relieve symptoms and slow the progress of the condition. Treatment can involve:  · Medicines such as:  ¨ Blood pressure medicines.  ¨ Medicines to relax (dilate) the pulmonary blood vessels.  ¨ Diuretic medicines.  ¨ Blood thinning medicines.  · Surgery. For severe pulmonary hypertension that does not respond to medical treatment, heart-lung or lung transplant may be needed.  HOME CARE INSTRUCTIONS  · Only take over-the-counter or prescription medicines as directed by your health care provider. Take all medicines exactly as instructed. Do not change or stop medicines without first checking with your health care provider.  · Do not smoke.  · Eat a healthy diet. Decrease   how much salt (sodium) you eat by checking nutrition labels and using seasonings without salt. Talk to your health care provider or a dietitian about foods you should eat.  · Stay as active as possible. Exercise as directed by your health care provider. Talk to your health care provider about what type of exercise is safe for you.  · Avoid high altitudes.  · Avoid hot tubs and saunas.  · Avoid becoming pregnant, if this  applies. Talk to your health care provider about safe methods of birth control.  · Follow up with your health care provider as directed.  SEEK IMMEDIATE MEDICAL CARE IF:  · You have severe shortness of breath.  · You develop chest pain or pressure.  · You cough up blood.  · You develop swelling of your feet or legs.  · You have a significant increase in weight over 1-2 days.  Document Released: 05/10/2007 Document Revised: 05/03/2013 Document Reviewed: 01/02/2013  ExitCare® Patient Information ©2015 [employer]. This information is not intended to replace advice given to you by your health care provider. Make sure you discuss any questions you have with your health care provider.

## 2015-04-09 NOTE — Progress Notes (Signed)
Date: 04/09/2015,   MRN# [id number] Debra Cantu 21-Oct-1927 Code Status:  Hosp day:@LENGTHOFSTAYDAYS @ Referring MD: @ATDPROV @     PCP:      AdmissionWeight: 183 lb (83.008 kg)                 CurrentWeight: 183 lb (83.008 kg) Debra Cantu is a 81 y.o. old female seen in consultation for SOba dn Sleep apnea     CHIEF COMPLAINT:   Follow up SOB   [place]   Patient states that she is feeling good, a no acute complaints, daughter in with visit I have explained PUlm HTN goals-patient/daughter has decided to revisit RT heart cath at later time Will see Dr. Ashby Dawes tomorrow for OSA eval Will check ambulating pulse ox today Patient had left sided chest pain several weks ago-stress test and ECG completed-will need to follow up with Dr. Ubaldo Glassing  Daughter/patient satisfied with visit Klonopin helping but need increased dose    PAST MEDICAL HISTORY   Past Medical History  Diagnosis Date  . Enlarged heart   . Pulmonary embolism   . Hypercholesteremia   . Hypertension   . Osteoporosis   . Macular degeneration   . Arthritis      SURGICAL HISTORY   Past Surgical History  Procedure Laterality Date  . Vein ligation Bilateral   . Vaginal hysterectomy  1978  . Ruptured disc repair      T4, T5  . Cholecystectomy  2003  . Reconstructive foot surgery  2002  . Hernia repair  2004  . Replacement total knee Right   . Cataract extraction  2013     FAMILY HISTORY   Family History  Problem Relation Age of Onset  . Heart disease Brother   . Stroke Father   . Alzheimer's disease Mother   . Cancer Sister     skin     SOCIAL HISTORY   Social History  Substance Use Topics  . Smoking status: Former Smoker -- 0.10 packs/day for 1 years    Types: Cigarettes    Quit date: 10/17/1954  . Smokeless tobacco: Never Used  . Alcohol Use: None     MEDICATIONS    Home Medication:  Current Outpatient Rx  Name  Route  Sig  Dispense  Refill  .  acetaminophen (TYLENOL) 325 MG tablet   Oral   Take 650 mg by mouth every 6 (six) hours as needed.         Marland Kitchen amLODipine (NORVASC) 5 MG tablet   Oral   Take 2.5 mg by mouth daily.         Marland Kitchen atorvastatin (LIPITOR) 80 MG tablet   Oral   Take 80 mg by mouth daily.         . beta carotene 15 MG capsule   Oral   Take 15 mg by mouth daily.         . carboxymethylcellulose (REFRESH PLUS) 0.5 % SOLN      1 drop 2 (two) times daily as needed.         . cholecalciferol (VITAMIN D) 400 UNITS TABS tablet   Oral   Take 400 Units by mouth daily.         . clonazePAM (KLONOPIN) 0.5 MG disintegrating tablet   Oral   Take 1 tablet (0.5 mg total) by mouth daily.   1 tablet   5   . furosemide (LASIX) 20 MG tablet   Oral   Take 1 tablet (20  mg total) by mouth daily.   30 tablet   11   . glucosamine-chondroitin 500-400 MG tablet   Oral   Take 1 tablet by mouth daily.         Marland Kitchen levothyroxine (SYNTHROID, LEVOTHROID) 88 MCG tablet   Oral   Take 88 mcg by mouth daily before breakfast.         . Multiple Vitamins-Minerals (PRESERVISION AREDS 2 PO)   Oral   Take 1 capsule by mouth daily.         . polyethylene glycol (MIRALAX / GLYCOLAX) packet   Oral   Take 17 g by mouth daily.         . valsartan (DIOVAN) 320 MG tablet   Oral   Take 320 mg by mouth daily.         Marland Kitchen warfarin (COUMADIN) 4 MG tablet   Oral   Take 3.5 mg by mouth daily.            Current Medication:  Current outpatient prescriptions:  .  acetaminophen (TYLENOL) 325 MG tablet, Take 650 mg by mouth every 6 (six) hours as needed., Disp: , Rfl:  .  amLODipine (NORVASC) 5 MG tablet, Take 2.5 mg by mouth daily., Disp: , Rfl:  .  atorvastatin (LIPITOR) 80 MG tablet, Take 80 mg by mouth daily., Disp: , Rfl:  .  beta carotene 15 MG capsule, Take 15 mg by mouth daily., Disp: , Rfl:  .  carboxymethylcellulose (REFRESH PLUS) 0.5 % SOLN, 1 drop 2 (two) times daily as needed., Disp: , Rfl:  .   cholecalciferol (VITAMIN D) 400 UNITS TABS tablet, Take 400 Units by mouth daily., Disp: , Rfl:  .  clonazePAM (KLONOPIN) 0.5 MG disintegrating tablet, Take 1 tablet (0.5 mg total) by mouth daily., Disp: 1 tablet, Rfl: 5 .  furosemide (LASIX) 20 MG tablet, Take 1 tablet (20 mg total) by mouth daily., Disp: 30 tablet, Rfl: 11 .  glucosamine-chondroitin 500-400 MG tablet, Take 1 tablet by mouth daily., Disp: , Rfl:  .  levothyroxine (SYNTHROID, LEVOTHROID) 88 MCG tablet, Take 88 mcg by mouth daily before breakfast., Disp: , Rfl:  .  Multiple Vitamins-Minerals (PRESERVISION AREDS 2 PO), Take 1 capsule by mouth daily., Disp: , Rfl:  .  polyethylene glycol (MIRALAX / GLYCOLAX) packet, Take 17 g by mouth daily., Disp: , Rfl:  .  valsartan (DIOVAN) 320 MG tablet, Take 320 mg by mouth daily., Disp: , Rfl:  .  warfarin (COUMADIN) 4 MG tablet, Take 3.5 mg by mouth daily. , Disp: , Rfl:     ALLERGIES   Oxycodone     REVIEW OF SYSTEMS   Review of Systems  Constitutional: Negative for fever, chills and weight loss.  Eyes: Negative for blurred vision.  Respiratory: Positive for shortness of breath. Negative for wheezing.   Cardiovascular: Positive for orthopnea and leg swelling. Negative for chest pain.  Gastrointestinal: Negative for nausea, vomiting and abdominal pain.  Genitourinary: Negative for flank pain.  Musculoskeletal: Negative.   Skin: Negative for rash.  Neurological: Negative for dizziness, tingling, tremors and headaches.  Endo/Heme/Allergies: Negative.   Psychiatric/Behavioral: Negative.   All other systems reviewed and are negative.    VS: BP 134/88 mmHg  Pulse 85  Ht 5' (1.524 m)  Wt 183 lb (83.008 kg)  BMI 35.74 kg/m2  SpO2 96%     PHYSICAL EXAM  Physical Exam  Constitutional: She is oriented to person, place, and time. She appears well-developed and well-nourished. No  distress.  HENT:  Head: Normocephalic and atraumatic.  Mouth/Throat: No oropharyngeal exudate.   Eyes: EOM are normal. Pupils are equal, round, and reactive to light. No scleral icterus.  Neck: Normal range of motion. Neck supple.  Cardiovascular: Normal rate, regular rhythm and normal heart sounds.   No murmur heard. Pulmonary/Chest: No stridor. No respiratory distress. She has no wheezes.  Abdominal: Soft. Bowel sounds are normal. She exhibits no distension. There is no tenderness. There is no rebound.  Musculoskeletal: Normal range of motion. She exhibits edema.  Neurological: She is alert and oriented to person, place, and time. She displays normal reflexes. Coordination normal.  Skin: Skin is warm. She is not diaphoretic.  Psychiatric: She has a normal mood and affect.           ASSESSMENT/PLAN    81 yo white female with presumed Pulmonary HTN with h/o PE along with signs and symptoms of Diastolic heart failure   Pulmonary hypertension She has evidence of pulmonary hypertension on an echocardiogram but she has not undergone a right heart catheterization-will discuss this at later date.  -I would not recommend cath at this point -continue CPAP as presribed-Follow up with Dr Ashby Dawes to assess her OSA.  -continue oxygen therapy as night -check ambulating pulse ox   Diatsolic Heart failure -continue lasix 20 mg -recommend follow up with cardiology   Anxiety -Klonipin increased to 0.5 mg.  Follow up 3 months  I have personally obtained a history, examined the patient, evaluated laboratory and independently reviewed imaging results, formulated the assessment and plan and placed orders.  The Patient requires high complexity decision making for assessment and support, frequent evaluation and titration of therapies, application of advanced monitoring technologies and extensive interpretation of multiple databases. Time spent with patient 45 minutes.  Patient is satisfied with Plan of action and management.    [name], M.D.  [facility]  Medical Director [place] Director [facility] Cardio-Pulmonary Department

## 2015-04-09 NOTE — Telephone Encounter (Signed)
Pharmacy called due to Dr. Mortimer Fries writing a Otho Darner of 1 tablet. Gave VO to give 30 tablets. Nothing further needed.

## 2015-04-10 ENCOUNTER — Other Ambulatory Visit
Admission: RE | Admit: 2015-04-10 | Discharge: 2015-04-10 | Disposition: A | Discharge status: Home / Self Care | Payer: Medicare Other | Source: Ambulatory Visit | Attending: Internal Medicine | Admitting: Internal Medicine

## 2015-04-10 ENCOUNTER — Ambulatory Visit: Payer: Medicare Other | Admitting: Internal Medicine

## 2015-04-10 ENCOUNTER — Ambulatory Visit (INDEPENDENT_AMBULATORY_CARE_PROVIDER_SITE_OTHER): Payer: Medicare Other | Admitting: Internal Medicine

## 2015-04-10 ENCOUNTER — Encounter: Payer: Self-pay | Admitting: Internal Medicine

## 2015-04-10 VITALS — BP 130/82 | HR 78 | Ht 60.0 in | Wt 183.0 lb

## 2015-04-10 DIAGNOSIS — G2581 Restless legs syndrome: Secondary | ICD-10-CM

## 2015-04-10 DIAGNOSIS — D649 Anemia, unspecified: Secondary | ICD-10-CM

## 2015-04-10 DIAGNOSIS — I270 Primary pulmonary hypertension: Secondary | ICD-10-CM

## 2015-04-10 DIAGNOSIS — G4761 Periodic limb movement disorder: Secondary | ICD-10-CM

## 2015-04-10 DIAGNOSIS — I272 Pulmonary hypertension, unspecified: Secondary | ICD-10-CM

## 2015-04-10 DIAGNOSIS — G4733 Obstructive sleep apnea (adult) (pediatric): Secondary | ICD-10-CM | POA: Diagnosis not present

## 2015-04-10 LAB — FERRITIN: FERRITIN: 51 ng/mL (ref 11–307)

## 2015-04-10 MED ORDER — ESZOPICLONE 1 MG PO TABS: 1.0000 mg | ORAL_TABLET | Freq: Every evening | ORAL | Status: DC | PRN

## 2015-04-10 NOTE — Addendum Note (Signed)
Addended by: Oscar La R on: 04/10/2015 02:44 PM   Modules accepted: Orders

## 2015-04-10 NOTE — Progress Notes (Addendum)
* [facility]  Addendum 07/23/15; review of the raw data from CPAP titration from 07/09/2015. Summary of this test is: Mean auto CPAP pressure was 8.5, auto CPAP peak average pressure was 9.5, average pressure about 90% of the time is 9.5, the average AHI was 1.5, overall, this indicates that pressure of 8 and 9 is adequate for this patient.   Assessment and Plan:  Obstructive sleep apnea. -Review the patient's most recent sleep study shows that her sleep apnea may be undertreated with current pressure, will order an auto titrating CPAP for 1 week. -Patient notes that her headgear is stretched out and she is not able to go to obtain a good seal, advised her that she can go to her homecare company and get new supplies every 3-6 months.  Periodic limb movements sleep, with arousals. -Most recent sleep study shows significant arousals with an arousal index of 5. -This may be secondary to primary periodic limb movements versus secondary to obstructive sleep apnea. -Will check a ferritin level.  Insomnia, sleep maintenance type. -Patient notes she wakes up most nights around 2 AM and has difficulty maintaining sleep past that time. -She notes she has been on Ambien for several years in the past and felt that she became dependent upon it. She is currently doing better on Klonopin. Discussed that Klonopin can also cause dependence and would prefer to use only if necessary. We'll therefore start her on Lunesta at this time and see if this is helpful, if not, we can go back to Klonopin.  Chronic rhinitis. -Start Rhinocort 2 sprays in each nostril every night.  -I've asked patient to follow-up with me in approximately 3 months time to review how she is doing and her response to medications. If at that time, she is doing well, she can be followed up with [place] on an a yearly basis for her sleep issues.  Date: 04/10/2015  MRN# [id number] Debra Cantu 30-Aug-1927   Debra Cantu is a 81  y.o. old female seen in follow up for chief complaint of  Chief Complaint  Patient presents with  . Advice Only    pt has CPAP; usually feels tired during day; took Clonzepam last night and sleep well    HPI:  The patient is an 81 year old female who sees my colleague for pulmonary hypertension. She is referred here because of a history of obstructive sleep apnea and is currently on CPAP therapy, the patient also has a history of anxiety and is on Klonopin, as well as a history of congestive heart failure.  Since her study her 28 and was previously with Dr. Raul Del but wanted to change physicians, she has also changed her home care company. She is currently using a nasal mask, she notes that she to continually tighten her mask. She hasn been waking frequently overnight due to back pain, when she wakes she would go to the bathroom. She has been started on a low dose of klonopin which has helped tremendously.  Goes to bed at 1030 she puts on her mask and is able to fall asleep in 15 minutes. She then wakes around 1 to 2 am, and often has to read to fall back to sleep. She will toss and turn until 5 am. She sometimes takes the mask off at that time.  Gets out of bed at 730. She naps around 11 am or 1 pm for about 1 to 2 hours, she does not use PAP at those times.  She notes now that since she is on the klonopin she has been able to sleep through the night. She has been on [place] for several years, last more than 10 years ago. She stopped using it because she felt she was becoming dependent on it.  She notes that she has been having a lot of runny nose issues.   -Review of old sleep study from 06/12/2014:-Sleep efficiency was 45%. Total sleep time was 183 minutes, latency was 7.7 minutes. The apnea hypopnea index was 18.7, the periodic limb movement index was 13.4 with an arousal index of 4.6. -Review of CPAP titration study performed on 08/23/2014 Sleep efficiency was 68% on that test. A CPAP of  8 was recommended. Review of the CPAP titration. Says that the patient continued to have significant arousals at a CPAP level of 8 as well as 10 also had snoring at both those levels. In addition of a CPAP level of 10. The patient had continued respiratory disturbance index of 10, with an apnea hypopnea index of 10 while at a CPAP level of 10. The patient's oxygen saturation also dropped level of 88%. At a CPAP level of 10 with 1 L of oxygen.  Medication:   Current Outpatient Rx  Name  Route  Sig  Dispense  Refill  . acetaminophen (TYLENOL) 325 MG tablet   Oral   Take 650 mg by mouth every 6 (six) hours as needed.         Marland Kitchen amLODipine (NORVASC) 5 MG tablet   Oral   Take 2.5 mg by mouth daily.         Marland Kitchen atorvastatin (LIPITOR) 80 MG tablet   Oral   Take 80 mg by mouth daily.         . beta carotene 15 MG capsule   Oral   Take 15 mg by mouth daily.         . carboxymethylcellulose (REFRESH PLUS) 0.5 % SOLN      1 drop 2 (two) times daily as needed.         . cholecalciferol (VITAMIN D) 400 UNITS TABS tablet   Oral   Take 400 Units by mouth daily.         . clonazePAM (KLONOPIN) 0.5 MG disintegrating tablet   Oral   Take 1 tablet (0.5 mg total) by mouth daily.   1 tablet   5   . furosemide (LASIX) 20 MG tablet   Oral   Take 1 tablet (20 mg total) by mouth daily.   30 tablet   11   . glucosamine-chondroitin 500-400 MG tablet   Oral   Take 1 tablet by mouth daily.         Marland Kitchen levothyroxine (SYNTHROID, LEVOTHROID) 88 MCG tablet   Oral   Take 88 mcg by mouth daily before breakfast.         . Multiple Vitamins-Minerals (PRESERVISION AREDS 2 PO)   Oral   Take 1 capsule by mouth daily.         . polyethylene glycol (MIRALAX / GLYCOLAX) packet   Oral   Take 17 g by mouth daily.         . valsartan (DIOVAN) 320 MG tablet   Oral   Take 320 mg by mouth daily.         Marland Kitchen warfarin (COUMADIN) 4 MG tablet   Oral   Take 3.5 mg by mouth daily.  Allergies:  Oxycodone  Review of Systems: Gen:  Denies  fever, sweats. HEENT: Denies blurred vision. Cvc:  No dizziness, chest pain or heaviness Resp:   Denies cough or sputum porduction. Gi: Denies swallowing difficulty, stomach pain. constipation, bowel incontinence Gu:  Denies bladder incontinence, burning urine Ext:   No Joint pain, stiffness. Skin: No skin rash, easy bruising. Endoc:  No polyuria, polydipsia. Psych: No depression, insomnia. Other:  All other systems were reviewed and found to be negative other than what is mentioned in the HPI.   Physical Examination:   VS: There were no vitals taken for this visit.  General Appearance: No distress  Neuro:without focal findings,  speech normal,  HEENT: PERRLA, EOM intact. Mallampati 3 Pulmonary: normal breath sounds, No wheezing.   CardiovascularNormal S1,S2.  No m/r/g.   Abdomen: Benign, Soft, non-tender. Renal:  No costovertebral tenderness  GU:  Not performed at this time. Endoc: No evident thyromegaly, no signs of acromegaly. Skin:   warm, no rash. Extremities: normal, no cyanosis, clubbing.   LABORATORY PANEL:   CBC No results for input(s): WBC, HGB, HCT, PLT in the last 168 hours. ------------------------------------------------------------------------------------------------------------------  Chemistries  No results for input(s): NA, K, CL, CO2, GLUCOSE, BUN, CREATININE, CALCIUM, MG, AST, ALT, ALKPHOS, BILITOT in the last 168 hours.  Invalid input(s): GFRCGP ------------------------------------------------------------------------------------------------------------------     Thank  you for allowing [facility] to assist in the care of your patient. Our recommendations are noted above.  Please contact us if we can be of further service.   Marda Stalker, MD.  [facility] Office Number: [phone]  Patricia Pesa, M.D.  Vilinda Boehringer, M.D.   Cheral Marker, M.D

## 2015-04-10 NOTE — Patient Instructions (Addendum)
--  Stop klonopin and start Lunesta every bedtime.  --Use CPAP whenever sleeping including naps.  --Try to use the CPAP the whole night.  --Will perform an autotitrating CPAP to check on the CPAP pressure, low pressue 8, high pressure 16 for 1 week.  --Start rhinocort, 2 sprays in each nostril every night.  --Check ferritin level.  --Follow up in 3 months.

## 2015-04-10 NOTE — Addendum Note (Signed)
Addended by: Oscar La R on: 04/10/2015 12:42 PM   Modules accepted: Orders

## 2015-04-11 ENCOUNTER — Telehealth: Payer: Self-pay | Admitting: *Deleted

## 2015-04-11 NOTE — Telephone Encounter (Signed)
Arbie Cookey from [facility] calling had some questions on mutual patient.  Please call

## 2015-04-11 NOTE — Telephone Encounter (Signed)
Message received and referral has been faxed to [facility]. I also spoke with Arbie Cookey at [facility] and she is aware that order has been faxed. Arbie Cookey will contact the pt's daughter and inform her that order has been received and will change pressure to her cpap machine. Rhonda J Cobb Nothing else needed at this time. Rhonda J Cobb

## 2015-04-11 NOTE — Telephone Encounter (Signed)
Received VM from [place] with [facility] in regards to pt. Number given to Rhonda to f/u on orders placed for pt's DME.

## 2015-04-12 ENCOUNTER — Ambulatory Visit: Payer: Medicare Other | Admitting: Internal Medicine

## 2015-04-19 ENCOUNTER — Telehealth: Payer: Self-pay | Admitting: *Deleted

## 2015-04-19 DIAGNOSIS — G4733 Obstructive sleep apnea (adult) (pediatric): Secondary | ICD-10-CM

## 2015-04-19 DIAGNOSIS — Z9989 Dependence on other enabling machines and devices: Principal | ICD-10-CM

## 2015-04-19 NOTE — Telephone Encounter (Signed)
O2 ordered with CPAP per DR.

## 2015-04-19 NOTE — Telephone Encounter (Signed)
Pt calling stating she is filling out paper work for [facility] and she needs to know the oxygen levels Please call back she needs to know.

## 2015-04-19 NOTE — Telephone Encounter (Signed)
Pt needed to know what liters of O2 she wears at night with her CPAP. She was informed she uses 2L of O2 that is in with her CPAP. Nothing further needed.

## 2015-07-03 ENCOUNTER — Ambulatory Visit (INDEPENDENT_AMBULATORY_CARE_PROVIDER_SITE_OTHER): Payer: Medicare Other | Admitting: Internal Medicine

## 2015-07-03 ENCOUNTER — Encounter: Payer: Self-pay | Admitting: Internal Medicine

## 2015-07-03 VITALS — BP 122/72 | HR 80 | Ht 60.0 in | Wt 185.6 lb

## 2015-07-03 DIAGNOSIS — I272 Other secondary pulmonary hypertension: Secondary | ICD-10-CM | POA: Diagnosis not present

## 2015-07-03 DIAGNOSIS — M940 Chondrocostal junction syndrome [Tietze]: Secondary | ICD-10-CM | POA: Diagnosis not present

## 2015-07-03 MED ORDER — PREDNISONE 20 MG PO TABS: 20.0000 mg | ORAL_TABLET | Freq: Every day | ORAL | Status: DC

## 2015-07-03 NOTE — Progress Notes (Signed)
Date: 07/03/2015,   MRN# [id number] Debra Cantu [birth year] Code Status:  Hosp day:@LENGTHOFSTAYDAYS @ Referring MD: @ATDPROV @     PCP:      AdmissionWeight: 185 lb 9.6 oz (84.188 kg)                 CurrentWeight: 185 lb 9.6 oz (84.188 kg) Debra Cantu is a 81 y.o. old female seen in consultation for SOba dn Sleep apnea     CHIEF COMPLAINT:   Follow up pleuritic chest pain fro 10 days   HISTORY OF PRESENT ILLNESS   Patient states that she is feeling good, but has chest wall pain for last 10 days no acute SOB, cough, no wheezing.  Patient with coughing spells that worsen chest pain, also deep breathing worsens chest pain Patient sitting comfortably, no resp distress       Current Medication:  Current outpatient prescriptions:  .  acetaminophen (TYLENOL) 325 MG tablet, Take 650 mg by mouth every 6 (six) hours as needed., Disp: , Rfl:  .  amLODipine (NORVASC) 5 MG tablet, Take 2.5 mg by mouth daily., Disp: , Rfl:  .  atorvastatin (LIPITOR) 80 MG tablet, Take 80 mg by mouth daily., Disp: , Rfl:  .  beta carotene 15 MG capsule, Take 15 mg by mouth daily., Disp: , Rfl:  .  carboxymethylcellulose (REFRESH PLUS) 0.5 % SOLN, 1 drop 2 (two) times daily as needed., Disp: , Rfl:  .  cholecalciferol (VITAMIN D) 400 UNITS TABS tablet, Take 400 Units by mouth daily., Disp: , Rfl:  .  clonazePAM (KLONOPIN) 0.5 MG disintegrating tablet, Take 1 tablet (0.5 mg total) by mouth daily., Disp: 1 tablet, Rfl: 5 .  cromolyn (NASALCROM) 5.2 MG/ACT nasal spray, 1 spray by Nasal route 4 (four) times daily., Disp: , Rfl:  .  eszopiclone (LUNESTA) 1 MG TABS tablet, Take 1 tablet (1 mg total) by mouth at bedtime as needed for sleep. Take immediately before bedtime, Disp: 30 tablet, Rfl: 0 .  furosemide (LASIX) 20 MG tablet, Take 1 tablet (20 mg total) by mouth daily., Disp: 30 tablet, Rfl: 11 .  glucosamine-chondroitin 500-400 MG tablet, Take 1 tablet by mouth daily., Disp: , Rfl:  .  levothyroxine  (SYNTHROID, LEVOTHROID) 88 MCG tablet, Take 88 mcg by mouth daily before breakfast., Disp: , Rfl:  .  Multiple Vitamins-Minerals (PRESERVISION AREDS 2 PO), Take 1 capsule by mouth daily., Disp: , Rfl:  .  polyethylene glycol (MIRALAX / GLYCOLAX) packet, Take 17 g by mouth daily., Disp: , Rfl:  .  valsartan (DIOVAN) 320 MG tablet, Take 320 mg by mouth daily., Disp: , Rfl:  .  warfarin (COUMADIN) 4 MG tablet, Take 3.5 mg by mouth daily. , Disp: , Rfl:     ALLERGIES   Oxycodone     REVIEW OF SYSTEMS   Review of Systems  Constitutional: Negative for fever and chills.  HENT: Positive for congestion.   Respiratory: Positive for cough. Negative for hemoptysis, sputum production, shortness of breath and wheezing.   Cardiovascular: Positive for chest pain. Negative for orthopnea and leg swelling.  Musculoskeletal: Negative.   Endo/Heme/Allergies: Negative.   Psychiatric/Behavioral: Negative.   All other systems reviewed and are negative.    VS: BP 122/72 mmHg  Pulse 80  Ht 5' (1.524 m)  Wt 185 lb 9.6 oz (84.188 kg)  BMI 36.25 kg/m2  SpO2 96%     PHYSICAL EXAM  Physical Exam  Constitutional: She appears well-developed and well-nourished. No distress.  Cardiovascular: Normal rate,  regular rhythm and normal heart sounds.   No murmur heard. Pulmonary/Chest: Effort normal and breath sounds normal. No respiratory distress. She has no wheezes.  Abdominal: Soft. Bowel sounds are normal.  Musculoskeletal: Normal range of motion. She exhibits no edema.  +chest wall tenderness along left costochondral sternum  Skin: Skin is warm. She is not diaphoretic.  Psychiatric: She has a normal mood and affect.            ASSESSMENT/PLAN    81 yo white female with presumed Pulmonary HTN with h/o PE along with signs and symptoms of Diastolic heart failure Patient has clinical signs and symptoms of Costochrondritis   Pulmonary hypertension She has evidence of pulmonary hypertension on  an echocardiogram but she has not undergone a right heart catheterization-will discuss this at later date.  -I would not recommend cath at this point -continue CPAP as presribed-Follow up with Dr Ashby Dawes to assess her OSA.  -continue oxygen therapy as night   Diatsolic Heart failure -continue lasix 20 mg -recommend follow up with cardiology as needed   Anxiety -Klonipin increased to 0.5 mg.  CostoChrondritis -due to the fact patient is on Coumadin, will NOT presrcibe NSAIDs due to increased risk of bleeding Will prescribe Prednisone instead  Follow up 6 months  The Patient requires high complexity decision making for assessment and support, frequent evaluation and titration of therapies, application of advanced monitoring technologies and extensive interpretation of multiple databases.   Patient is satisfied with Plan of action and management.    [name], M.D.  [facility]  Medical Director [place] Director [facility] Cardio-Pulmonary Department

## 2015-07-03 NOTE — Patient Instructions (Signed)

## 2015-07-09 ENCOUNTER — Encounter: Payer: Medicare Other | Admitting: Internal Medicine

## 2015-07-09 ENCOUNTER — Encounter: Payer: Self-pay | Admitting: Internal Medicine

## 2015-07-09 NOTE — Progress Notes (Signed)
[facility] Consultation      Assessment and Plan:  Obstructive sleep apnea.    Date: 07/09/2015  MRN# [id number] Julianna Zengel [birth year]  Referring Physician: Dr. Mitzie Na Stoltzfoos is a 81 y.o. old female seen in consultation for chief complaint of:   No chief complaint on file.   HPI:   The patient is an 81 year old female who sees my colleague, Dr. Mortimer Fries for pulmonary hypertension. She is referred here because of a history of obstructive sleep apnea, she also has a history of anxiety and diastolic heart failure. She has been on CPAP.  Review of testing -Sleep study 06/20/2014: Sleep efficiency was 45%, sleep latency was 7 minutes. Apnea-hypopnea index was 19. AHI plus respiratory disturbance index was 58. PLMS index was 13, with an arousal index of 5. -CPAP titration study 09/05/2014: CPAP was titrated up to a level of 10, continued AHI of 6 at this level. She did appear to achieve REM sleep at this level.  SIX MIN WALK 07/03/2015 04/09/2015  Supplimental Oxygen during Test? (L/min) No No  Tech Comments: - pt walked at her normal pace while talking.    Pulmonary Functions Testing Results:  No results found for: FEV1, FVC, FEV1FVC, TLC, DLCO   PMHX:   Past Medical History  Diagnosis Date  . Enlarged heart   . Pulmonary embolism ([place])   . Hypercholesteremia   . Hypertension   . Osteoporosis   . Macular degeneration   . Arthritis    Surgical Hx:  Past Surgical History  Procedure Laterality Date  . Vein ligation Bilateral   . Vaginal hysterectomy  1978  . Ruptured disc repair      T4, T5  . Cholecystectomy  2003  . Reconstructive foot surgery  2002  . Hernia repair  2004  . Replacement total knee Right   . Cataract extraction  2013   Family Hx:  Family History  Problem Relation Age of Onset  . Heart disease Brother   . Stroke Father   . Alzheimer's disease Mother   . Cancer Sister     skin   Social Hx:   Social History    Substance Use Topics  . Smoking status: Former Smoker -- 0.10 packs/day for 1 years    Types: Cigarettes    Quit date: 10/17/1954  . Smokeless tobacco: Never Used  . Alcohol Use: Not on file   Medication:   Current Outpatient Rx  Name  Route  Sig  Dispense  Refill  . acetaminophen (TYLENOL) 325 MG tablet   Oral   Take 650 mg by mouth every 6 (six) hours as needed.         Marland Kitchen amLODipine (NORVASC) 5 MG tablet   Oral   Take 2.5 mg by mouth daily.         Marland Kitchen atorvastatin (LIPITOR) 80 MG tablet   Oral   Take 80 mg by mouth daily.         . beta carotene 15 MG capsule   Oral   Take 15 mg by mouth daily.         . carboxymethylcellulose (REFRESH PLUS) 0.5 % SOLN      1 drop 2 (two) times daily as needed.         . cholecalciferol (VITAMIN D) 400 UNITS TABS tablet   Oral   Take 400 Units by mouth daily.         . clonazePAM (KLONOPIN) 0.5 MG disintegrating tablet  Oral   Take 1 tablet (0.5 mg total) by mouth daily.   1 tablet   5   . cromolyn (NASALCROM) 5.2 MG/ACT nasal spray      1 spray by Nasal route 4 (four) times daily.         . eszopiclone (LUNESTA) 1 MG TABS tablet   Oral   Take 1 tablet (1 mg total) by mouth at bedtime as needed for sleep. Take immediately before bedtime   30 tablet   0   . furosemide (LASIX) 20 MG tablet   Oral   Take 1 tablet (20 mg total) by mouth daily.   30 tablet   11   . glucosamine-chondroitin 500-400 MG tablet   Oral   Take 1 tablet by mouth daily.         Marland Kitchen levothyroxine (SYNTHROID, LEVOTHROID) 88 MCG tablet   Oral   Take 88 mcg by mouth daily before breakfast.         . Multiple Vitamins-Minerals (PRESERVISION AREDS 2 PO)   Oral   Take 1 capsule by mouth daily.         . polyethylene glycol (MIRALAX / GLYCOLAX) packet   Oral   Take 17 g by mouth daily.         . predniSONE (DELTASONE) 20 MG tablet   Oral   Take 1 tablet (20 mg total) by mouth daily with breakfast.   10 tablet   0   .  valsartan (DIOVAN) 320 MG tablet   Oral   Take 320 mg by mouth daily.         Marland Kitchen warfarin (COUMADIN) 4 MG tablet   Oral   Take 3.5 mg by mouth daily.              Allergies:  Oxycodone  Review of Systems: Gen:  Denies  fever, sweats, chills HEENT: Denies blurred vision, double vision. bleeds, sore throat Cvc:  No dizziness, chest pain. Resp:   Denies cough or sputum porduction, shortness of breath Gi: Denies swallowing difficulty, stomach pain. Gu:  Denies bladder incontinence, burning urine Ext:   No Joint pain, stiffness. Skin: No skin rash,  hives Endoc:  No polyuria, polydipsia. Psych: No depression, insomnia. Other:  All other systems were reviewed with the patient and were negative other that what is mentioned in the HPI.   Physical Examination:   VS: There were no vitals taken for this visit.  General Appearance: No distress  Neuro:without focal findings,  speech normal,  HEENT: PERRLA, EOM intact.   Pulmonary: normal breath sounds, No wheezing.  CardiovascularNormal S1,S2.  No m/r/g.   Abdomen: Benign, Soft, non-tender. Renal:  No costovertebral tenderness  GU:  No performed at this time. Endoc: No evident thyromegaly, no signs of acromegaly. Skin:   warm, no rashes, no ecchymosis  Extremities: normal, no cyanosis, clubbing.  Other findings:    LABORATORY PANEL:   CBC No results for input(s): WBC, HGB, HCT, PLT in the last 168 hours. ------------------------------------------------------------------------------------------------------------------  Chemistries  No results for input(s): NA, K, CL, CO2, GLUCOSE, BUN, CREATININE, CALCIUM, MG, AST, ALT, ALKPHOS, BILITOT in the last 168 hours.  Invalid input(s): GFRCGP ------------------------------------------------------------------------------------------------------------------  Cardiac Enzymes No results for input(s): TROPONINI in the last 168  hours. ------------------------------------------------------------  RADIOLOGY:  No results found.     Thank  you for the consultation and for allowing [facility] to assist in the care of your patient. Our recommendations are noted above.  Please contact us if we can be of further service.   Marda Stalker, MD.  Board Certified in Internal Medicine, Pulmonary Medicine, [place], and Sleep Medicine.  [facility] Office Number: [phone]  Patricia Pesa, M.D.  Vilinda Boehringer, M.D.  Merton Border, M.D  This encounter was created in error - please disregard.

## 2015-07-30 ENCOUNTER — Telehealth: Payer: Self-pay | Admitting: Internal Medicine

## 2015-07-30 NOTE — Telephone Encounter (Signed)
FYI   Pt states that she wears cpap with o2 bled in. Pt states had bad head cold on 07/28/15 and could not tolerate o2, so she didn't wear cpap at all on 07/28/15.  However, she was feeling better and was able to wear the cpap the next night on 07/29/15 with no problem.  Pt was concerned about compliance on cpap.

## 2015-07-30 NOTE — Telephone Encounter (Signed)
Notified patient that according to Dr. Ashby Dawes she should be fine on compliance if she only missed one night.  Pt thank me and the doctor for our prompt attention in this matter. Pt wanted to make sure this was documented in case something came up in regards to her compliance down the line. Debra Cantu Nothing else needed at this time. Debra Cantu

## 2015-07-30 NOTE — Telephone Encounter (Signed)
Her compliance should be fine if she was only off it for one night.

## 2015-08-06 ENCOUNTER — Ambulatory Visit: Payer: Medicare Other | Admitting: Internal Medicine

## 2015-08-23 ENCOUNTER — Encounter: Payer: Self-pay | Admitting: Internal Medicine

## 2015-08-23 ENCOUNTER — Ambulatory Visit (INDEPENDENT_AMBULATORY_CARE_PROVIDER_SITE_OTHER): Payer: Medicare Other | Admitting: Internal Medicine

## 2015-08-23 VITALS — BP 132/76 | HR 87 | Ht 60.0 in | Wt 184.4 lb

## 2015-08-23 DIAGNOSIS — G4700 Insomnia, unspecified: Secondary | ICD-10-CM

## 2015-08-23 DIAGNOSIS — J309 Allergic rhinitis, unspecified: Secondary | ICD-10-CM | POA: Diagnosis not present

## 2015-08-23 DIAGNOSIS — G4733 Obstructive sleep apnea (adult) (pediatric): Secondary | ICD-10-CM | POA: Diagnosis not present

## 2015-08-23 MED ORDER — CROMOLYN SODIUM 5.2 MG/ACT NA AERS: 2.0000 | INHALATION_SPRAY | Freq: Three times a day (TID) | NASAL | Status: DC

## 2015-08-23 NOTE — Progress Notes (Signed)
* [facility]     Assessment and Plan:  Obstructive sleep apnea. -Review the patient's most recent sleep study shows that her sleep apnea may be undertreated . Based on her auto titrating  CPAP. I will increase her pressure to a level of 10. -Reminded her today that she can get new supplies every 3-6 months.  Periodic limb movements sleep, with arousals. -Most recent sleep study shows significant arousals with an arousal index of 5. -Ferritin level was normal. Likely secondary to obstructive sleep apnea.   Insomnia, sleep maintenance type. -This appears improved since being on her cpap every night.  -She notes she has been on Ambien for several years in the past and felt that she became dependent upon it, then was on klonopin, but no longer using it.   Chronic rhinitis. -Will give her script for cromolyn nasal spray, which she is currently using and feels that it helps.      Date: 08/23/2015  MRN# [id number] Debra Cantu 01-28-1928   Debra Cantu is a 82 y.o. old female seen in follow up for chief complaint of  Chief Complaint  Patient presents with  . Follow-up    pt. states she wears CPAP 7-8 hr. everynight. pressure is good. supplies needed (mask). DME: [facility].     HPI:   The patient is a 82 year old female with a history of pulmonary hypertension, diastolic dysfunction. She normally sees Dr. Mortimer Fries, for these problems. I have also seen her for her history of obstructive sleep apnea, restless leg syndrome, periodic limb movements in sleep.  At last visit, she was transitioned from [place] to [place], started on Rhinocort, asked to use her CPAP for the entire night, and had a ferritin level checked which was normal, in addition to a CPAP auto titrating test. She notes that she is a bit more awake since doing auto-PAP.   She is no longer using klonopin, and never started the lunesta, but in any case she is doing well. She did not start the rhinocort, she  got a script for cromolyn nasal spray, not sure from where, but she is using it bid and notes that it helps.    Review and summary of testing: Ferritin level: 51, this is normal, and is likely noncontributory to the patient's PLMS. Review of tracings from auto Pap titration completed 07/09/2015: The patient's average device pressure was 9.47 m water residual AHI was 1.5. Review of tracings from overnight oximetry 09/13/2014: Nocturnal oximetry performed while wearing CPAP and 2 L of oxygen showed minimal desaturations with adequate oxygenation overnight.  SIX MIN WALK 07/03/2015 04/09/2015  Supplimental Oxygen during Test? (L/min) No No  Tech Comments: - pt walked at her normal pace while talking.    Medication:   Outpatient Encounter Prescriptions as of 08/23/2015  Medication Sig  . acetaminophen (TYLENOL) 325 MG tablet Take 650 mg by mouth every 6 (six) hours as needed.  Marland Kitchen amLODipine (NORVASC) 5 MG tablet Take 2.5 mg by mouth daily.  Marland Kitchen atorvastatin (LIPITOR) 80 MG tablet Take 80 mg by mouth daily.  . beta carotene 15 MG capsule Take 15 mg by mouth daily.  . carboxymethylcellulose (REFRESH PLUS) 0.5 % SOLN 1 drop 2 (two) times daily as needed.  . cholecalciferol (VITAMIN D) 400 UNITS TABS tablet Take 400 Units by mouth daily.  . clonazePAM (KLONOPIN) 0.5 MG disintegrating tablet Take 1 tablet (0.5 mg total) by mouth daily.  . cromolyn (NASALCROM) 5.2 MG/ACT nasal spray 1 spray by Nasal route  4 (four) times daily.  . eszopiclone (LUNESTA) 1 MG TABS tablet Take 1 tablet (1 mg total) by mouth at bedtime as needed for sleep. Take immediately before bedtime  . furosemide (LASIX) 20 MG tablet Take 1 tablet (20 mg total) by mouth daily.  Marland Kitchen glucosamine-chondroitin 500-400 MG tablet Take 1 tablet by mouth daily.  Marland Kitchen levothyroxine (SYNTHROID, LEVOTHROID) 88 MCG tablet Take 88 mcg by mouth daily before breakfast.  . Multiple Vitamins-Minerals (PRESERVISION AREDS 2 PO) Take 1 capsule by mouth daily.  .  polyethylene glycol (MIRALAX / GLYCOLAX) packet Take 17 g by mouth daily.  . predniSONE (DELTASONE) 20 MG tablet Take 1 tablet (20 mg total) by mouth daily with breakfast.  . valsartan (DIOVAN) 320 MG tablet Take 320 mg by mouth daily.  Marland Kitchen warfarin (COUMADIN) 4 MG tablet Take 3.5 mg by mouth daily.    No facility-administered encounter medications on file as of 08/23/2015.     Allergies:  Oxycodone  Review of Systems: Gen:  Denies  fever, sweats. HEENT: Denies blurred vision. Cvc:  No dizziness, chest pain or heaviness Resp:   Denies cough or sputum porduction. Gi: Denies swallowing difficulty, stomach pain.  Gu:  Denies bladder incontinence, burning urine Ext:   No Joint pain, stiffness. Skin: No skin rash, easy bruising. Endoc:  No polyuria, polydipsia. Psych: No depression, insomnia. Other:  All other systems were reviewed and found to be negative other than what is mentioned in the HPI.   Physical Examination:   VS: BP 132/76 mmHg  Pulse 87  Ht 5' (1.524 m)  Wt 184 lb 6.4 oz (83.643 kg)  BMI 36.01 kg/m2  SpO2 95%  General Appearance: No distress  Neuro:without focal findings,  speech normal,  HEENT: PERRLA, EOM intact. Pulmonary: normal breath sounds, No wheezing.   CardiovascularNormal S1,S2.  No m/r/g.   Abdomen: Benign, Soft, non-tender. Renal:  No costovertebral tenderness  GU:  Not performed at this time. Endoc: No evident thyromegaly, no Cantu of acromegaly. Skin:   warm, no rash. Extremities: normal, no cyanosis, clubbing.   LABORATORY PANEL:   CBC No results for input(s): WBC, HGB, HCT, PLT in the last 168 hours. ------------------------------------------------------------------------------------------------------------------  Chemistries  No results for input(s): NA, K, CL, CO2, GLUCOSE, BUN, CREATININE, CALCIUM, MG, AST, ALT, ALKPHOS, BILITOT in the last 168 hours.  Invalid input(s):  GFRCGP ------------------------------------------------------------------------------------------------------------------  Cardiac Enzymes No results for input(s): TROPONINI in the last 168 hours. ------------------------------------------------------------  RADIOLOGY:   No results found for this or any previous visit. Results for orders placed in visit on 12/17/14  DG Chest 2 View   ------------------------------------------------------------------------------------------------------------------  Thank  you for allowing [facility] to assist in the care of your patient. Our recommendations are noted above.  Please contact us if we can be of further service.   Marda Stalker, MD.   Pulmonary and Critical Care Office Number: [phone]  Patricia Pesa, M.D.  Vilinda Boehringer, M.D.  Merton Border, M.D

## 2015-08-23 NOTE — Patient Instructions (Addendum)
--  Needs new cpap supplies, call your DME company to get these supplies, you can call as often as every 3 months to get new supplies.   --Increase cpap pressure to 10.   --Cromolyn nasal spray, 2 sprays in each nostril twice daily, 1 years supply.

## 2015-09-27 ENCOUNTER — Telehealth: Payer: Self-pay | Admitting: Internal Medicine

## 2015-09-27 NOTE — Telephone Encounter (Signed)
Called spoke with pt. She reports she needs a refill but does not know which medication.  Reports she told [facility] to call us. I called [facility] and spoke with Richardson Landry. He reports pt requested a refill on Lunesta 1 mg tablet Last refilled 04/10/15 #30 x 0 refills Take 1 tablet (1 mg total) by mouth at bedtime as needed for sleep. Take immediately before bedtime  Please advise Dr. Ashby Dawes thanks

## 2015-09-27 NOTE — Telephone Encounter (Signed)
Pt requesting refill on Lunesta 1mg . Last refill was 04/10/15 #30 no refills. Last OV was 08/23/15. Please advise.

## 2015-09-30 NOTE — Telephone Encounter (Signed)
Pt called back and states she went and got some melatonin and states it is working. Disregard this message.

## 2015-10-13 IMAGING — CR DG CHEST 2V
1 series · 2 of 2 positions shown · non-contrast
Comparison: Chest radiograph March 16, 2014 and chest CT May 08, 2014

CLINICAL DATA: Pulmonary arterial hypertension. Difficulty
breathing

EXAM:
CHEST  2 VIEW

[Series 1: w chest pa · 0.14mm/px · 2 of 2 slices shown]
[im 1/2]
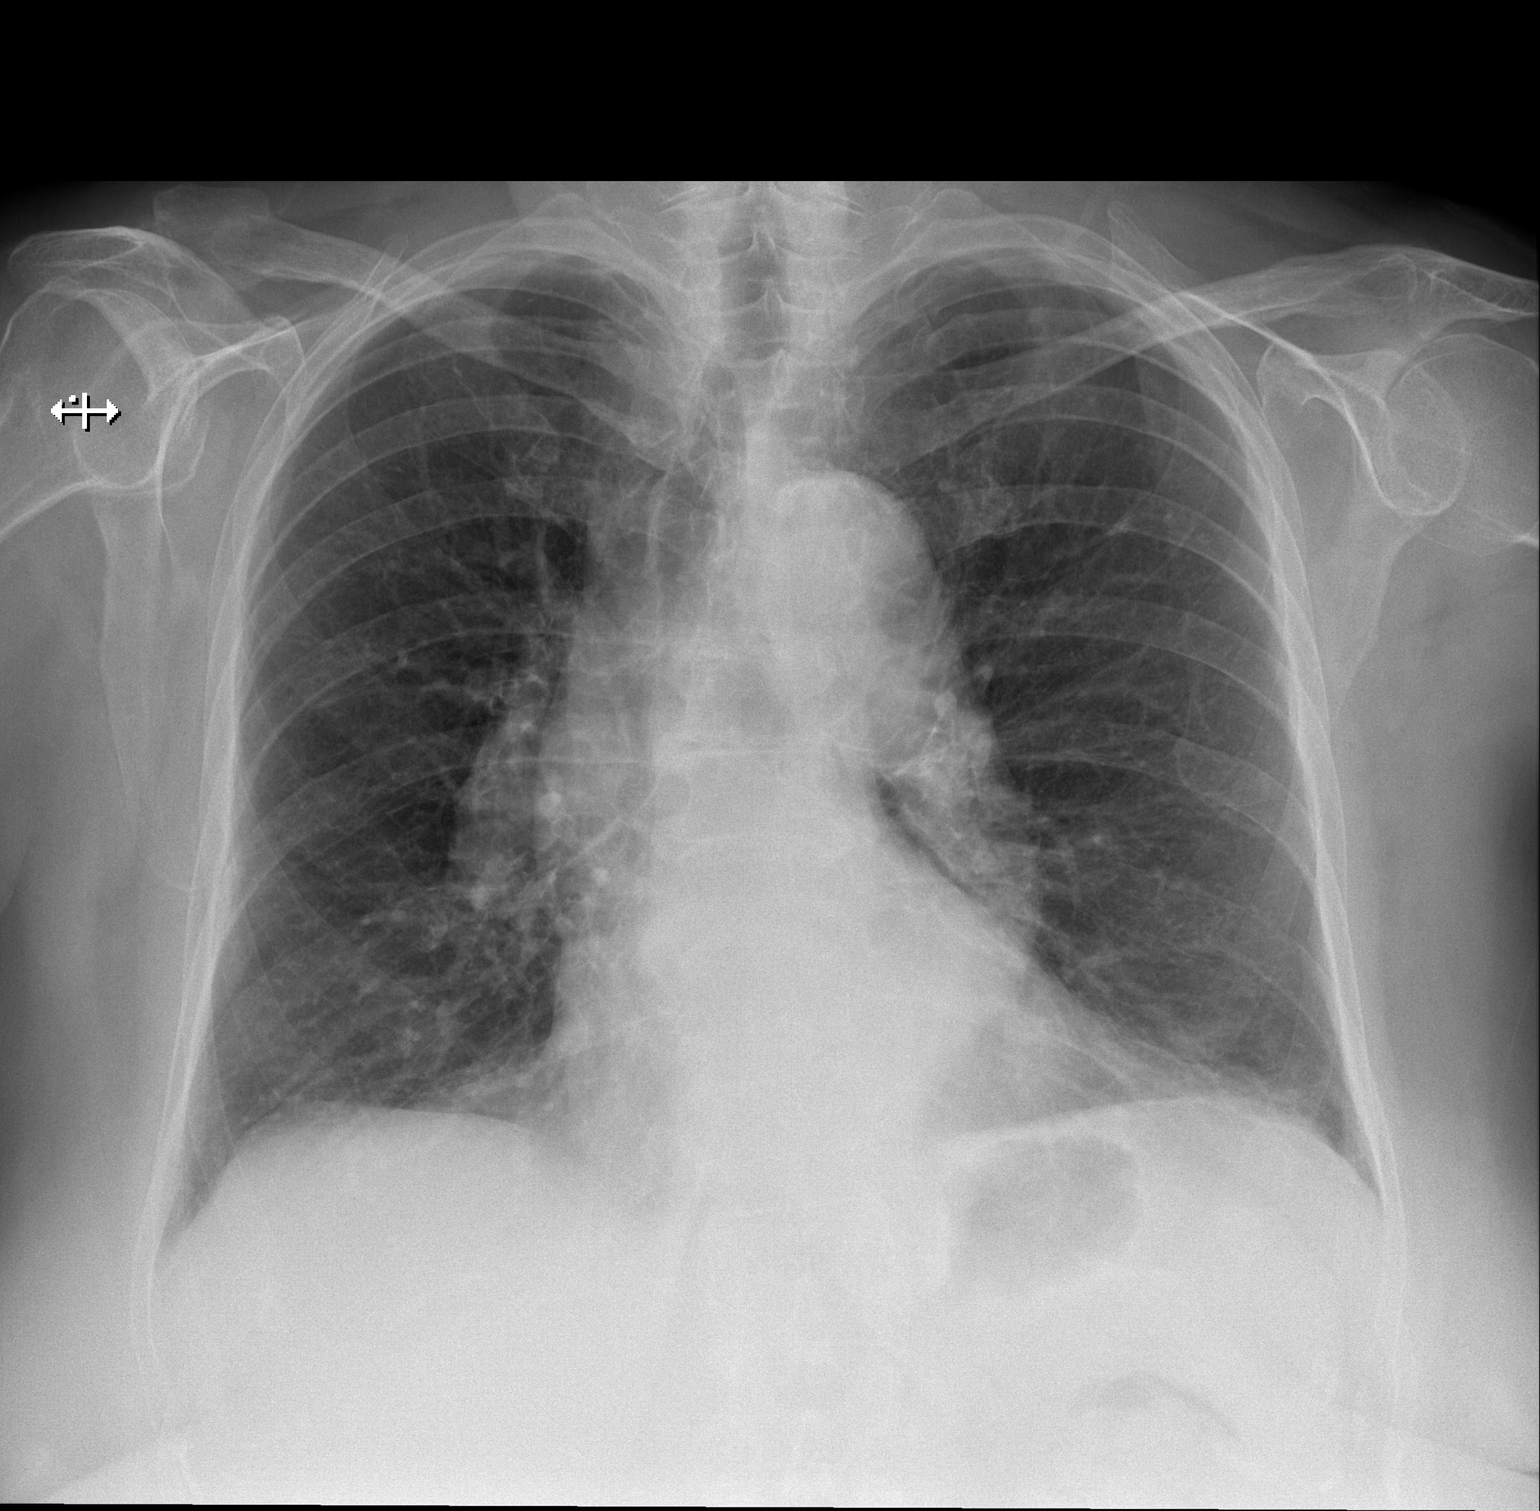
[im 2/2]
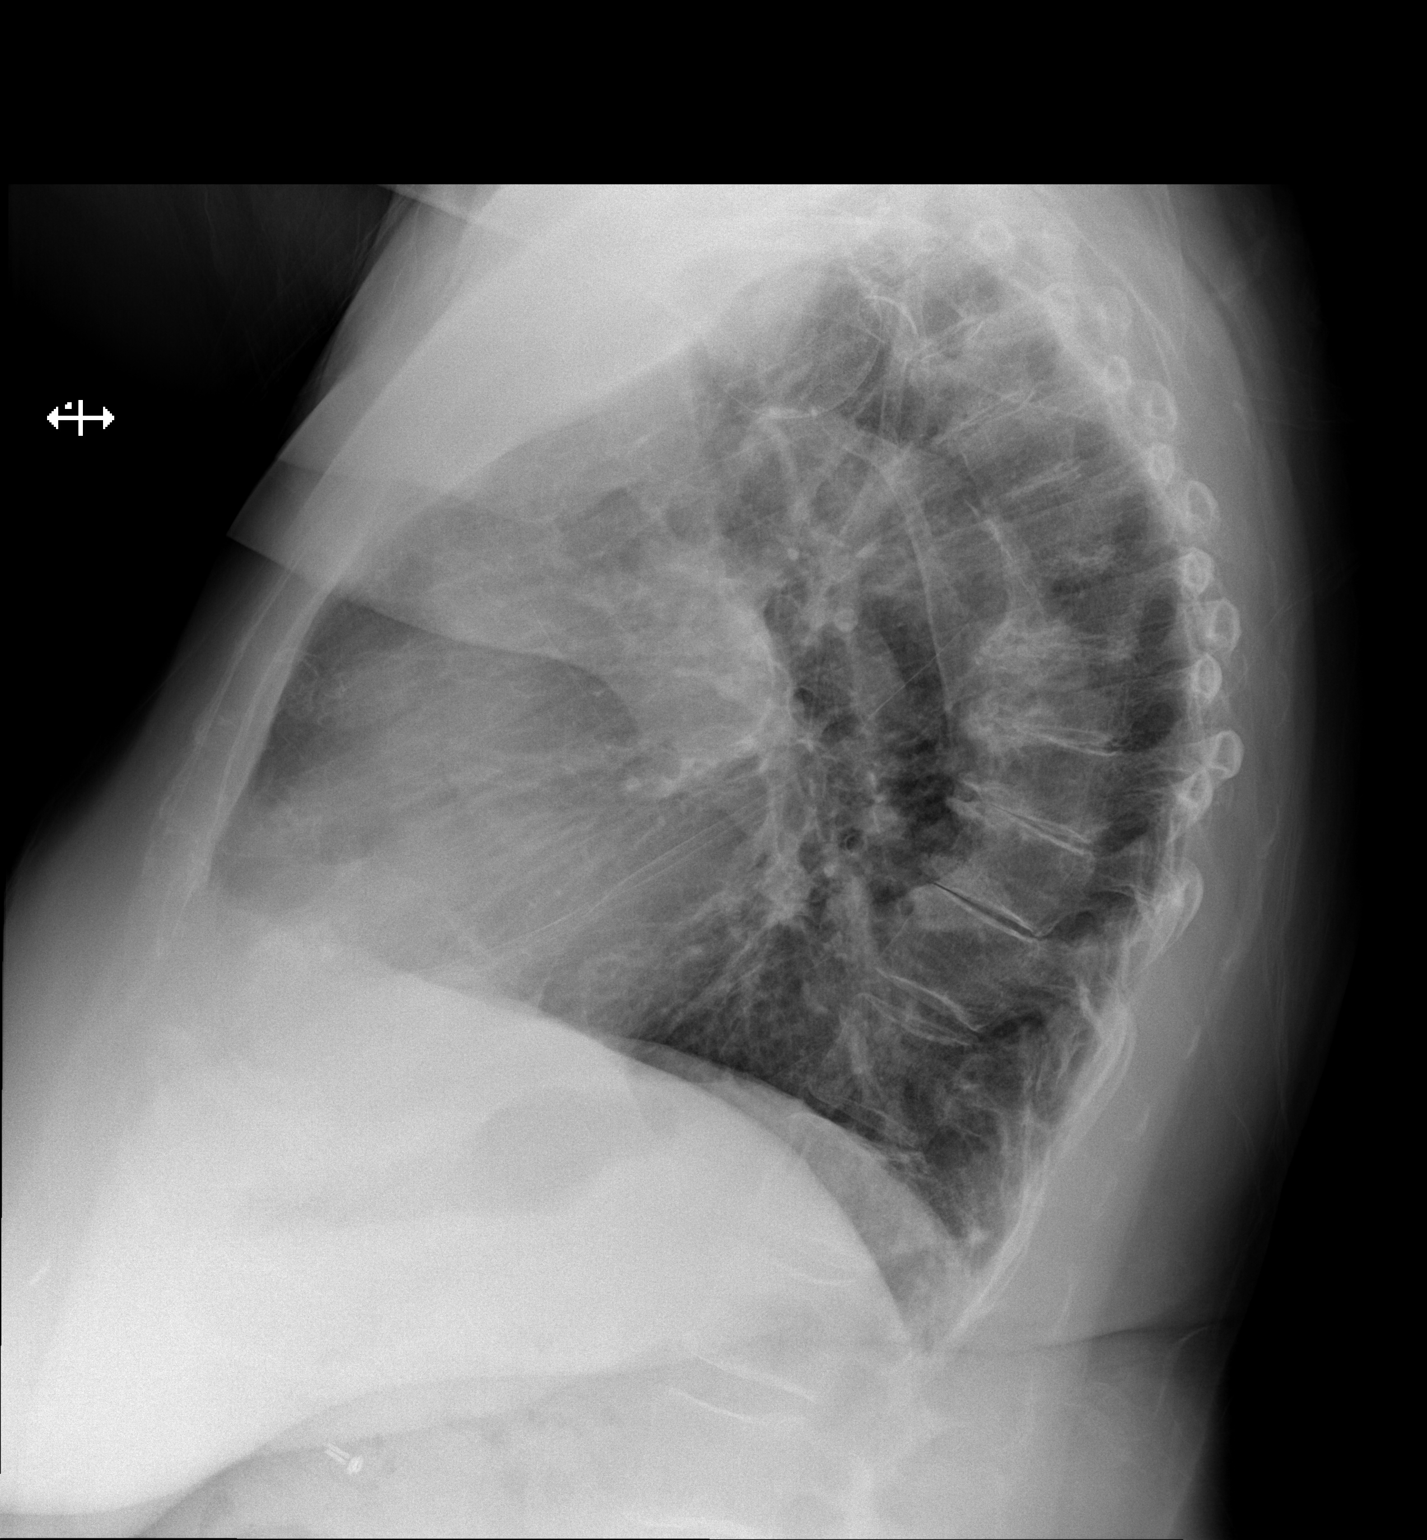

[2 of 2 positions shown; findings below may reference images not displayed]

FINDINGS: There is mild scarring in the left base. There is no edema or
consolidation. The heart size is normal. There is prominence of the
main pulmonary arteries with rapid peripheral tapering consistent
with pulmonary arterial hypertension. This finding appears stable
compared to the previous study. Aorta is mildly tortuous with
atherosclerotic change in the aorta, stable. No adenopathy. There is
degenerative change in the thoracic spine. Hemangioma in the T12
vertebral body is present.
IMPRESSION: Evidence of pulmonary arterial hypertension. Mild scarring left
base. No edema or consolidation. Heart size within normal limits.

## 2015-12-31 ENCOUNTER — Encounter: Payer: Self-pay | Admitting: Internal Medicine

## 2015-12-31 ENCOUNTER — Ambulatory Visit (INDEPENDENT_AMBULATORY_CARE_PROVIDER_SITE_OTHER): Payer: Medicare Other | Admitting: Internal Medicine

## 2015-12-31 ENCOUNTER — Encounter: Payer: Self-pay | Admitting: *Deleted

## 2015-12-31 VITALS — BP 138/88 | HR 76 | Ht 60.0 in | Wt 188.0 lb

## 2015-12-31 DIAGNOSIS — I272 Other secondary pulmonary hypertension: Secondary | ICD-10-CM | POA: Diagnosis not present

## 2015-12-31 NOTE — Progress Notes (Signed)
Date: 12/31/2015,   MRN# [id number] Debra Cantu 07-Jan-1928 Code Status:  Hosp day:@LENGTHOFSTAYDAYS @ Referring MD: @ATDPROV @     PCP:      AdmissionWeight: 188 lb (85.276 kg)                 CurrentWeight: 188 lb (85.276 kg) Debra Cantu is a 82 y.o. old female seen in consultation for SOba dn Sleep apnea     CHIEF COMPLAINT:   Follow up pleuritic chest pain for 10 days   HISTORY OF PRESENT ILLNESS   Patient states that she is feeling good,but easily fatigue, her sleep is much better in last several weeks no acute SOB, cough, no wheezing. Patient sitting comfortably, no resp distress No signs of infection at this time Ambulating pulse oximetry today shows o2sat 95% with exertion      Current Medication:  Current outpatient prescriptions:  .  acetaminophen (TYLENOL) 325 MG tablet, Take 650 mg by mouth every 6 (six) hours as needed., Disp: , Rfl:  .  amLODipine (NORVASC) 5 MG tablet, Take 2.5 mg by mouth daily., Disp: , Rfl:  .  atorvastatin (LIPITOR) 80 MG tablet, Take 80 mg by mouth daily., Disp: , Rfl:  .  beta carotene 15 MG capsule, Take 15 mg by mouth daily., Disp: , Rfl:  .  carboxymethylcellulose (REFRESH PLUS) 0.5 % SOLN, 1 drop 2 (two) times daily as needed., Disp: , Rfl:  .  cholecalciferol (VITAMIN D) 400 UNITS TABS tablet, Take 400 Units by mouth daily., Disp: , Rfl:  .  cromolyn (NASALCROM) 5.2 MG/ACT nasal spray, 1 spray by Nasal route 4 (four) times daily., Disp: , Rfl:  .  furosemide (LASIX) 20 MG tablet, Take 1 tablet (20 mg total) by mouth daily., Disp: 30 tablet, Rfl: 11 .  glucosamine-chondroitin 500-400 MG tablet, Take 1 tablet by mouth daily., Disp: , Rfl:  .  levothyroxine (SYNTHROID, LEVOTHROID) 88 MCG tablet, Take 88 mcg by mouth daily before breakfast., Disp: , Rfl:  .  Melatonin 3 MG TABS, Take 1 tablet by mouth at bedtime., Disp: , Rfl:  .  Multiple Vitamins-Minerals (PRESERVISION AREDS 2 PO), Take 1 capsule by mouth daily., Disp: , Rfl:  .   polyethylene glycol (MIRALAX / GLYCOLAX) packet, Take 17 g by mouth daily., Disp: , Rfl:  .  valsartan (DIOVAN) 320 MG tablet, Take 320 mg by mouth daily., Disp: , Rfl:  .  warfarin (COUMADIN) 4 MG tablet, Take 3.5 mg by mouth daily. , Disp: , Rfl:     ALLERGIES   Oxycodone     REVIEW OF SYSTEMS   Review of Systems  Constitutional: Positive for malaise/fatigue. Negative for fever and chills.  HENT: Negative for congestion.   Respiratory: Negative for cough, hemoptysis, sputum production, shortness of breath and wheezing.   Cardiovascular: Negative for chest pain, palpitations, orthopnea and leg swelling.  Musculoskeletal: Positive for back pain.  Neurological: Negative for weakness.  Endo/Heme/Allergies: Negative.   Psychiatric/Behavioral: Negative.   All other systems reviewed and are negative.    VS: BP 138/88 mmHg  Pulse 76  Ht 5' (1.524 m)  Wt 188 lb (85.276 kg)  BMI 36.72 kg/m2  SpO2 95%     PHYSICAL EXAM  Physical Exam  Constitutional: She is oriented to person, place, and time. No distress.  Cardiovascular: Normal rate, regular rhythm and normal heart sounds.   No murmur heard. Pulmonary/Chest: Effort normal and breath sounds normal. No respiratory distress. She has no wheezes.  Musculoskeletal: Normal range of  motion. She exhibits edema.  Neurological: She is alert and oriented to person, place, and time. No cranial nerve deficit.  Skin: Skin is warm. She is not diaphoretic.  Psychiatric: She has a normal mood and affect.        ASSESSMENT/PLAN    82 yo white female with presumed Pulmonary HTN with h/o PE along with signs and symptoms of Diastolic heart failure    Pulmonary hypertension with fatigue She has evidence of pulmonary hypertension on an echocardiogram but she has not undergone a right heart catheterization- -I would not recommend cath at this point -continue CPAP as presribed-Follow up with Dr Ashby Dawes to assess her OSA.  -continue  oxygen therapy as night   Diastolic Heart failure -continue lasix 20 mg -recommend follow up with cardiology as needed   Anxiety -Klonipin  0.5 mg.  Follow up 6 months  The Patient requires high complexity decision making for assessment and support, frequent evaluation and titration of therapies, application of advanced monitoring technologies and extensive interpretation of multiple databases.   Patient is satisfied with Plan of action and management.    [name], M.D.  [facility]  Medical Director [place] Director [facility] Cardio-Pulmonary Department

## 2015-12-31 NOTE — Patient Instructions (Signed)
Pulmonary Hypertension Pulmonary hypertension is high blood pressure within the arteries in your lungs (pulmonary arteries). It is different than having high blood pressure elsewhere in your body, such as blood pressure that is measured with a blood pressure cuff. Pulmonary hypertension makes it harder for blood to flow through the lungs. As a result, the heart must work harder to pump blood through the lungs, and it may be harder for you to breathe. Over time, this can weaken the heart muscle. Pulmonary hypertension is a serious condition and it can be fatal.  CAUSES Many different medical conditions can cause pulmonary hypertension. Pulmonary hypertension can be categorized by cause into five groups: Group 1 Pulmonary hypertension that is caused by abnormal growth of small blood vessels in the lungs (pulmonary arterial hypertension). The abnormal blood vessel growth may have no known cause, or it may be:  Passed along from a parent (hereditary).  Caused by another disease, such as a connective tissue disease (including lupus or scleroderma) or HIV.  Caused by certain drugs or toxins. Group 2 Pulmonary hypertension that is caused by weakness of the main chamber of the heart (left ventricle) or heart valve disease. Group 3 Pulmonary hypertension that is caused by lung disease or low oxygen levels. Causes in this group include:  Emphysema or chronic obstructive pulmonary disease (COPD).  Untreated sleep apnea.  Pulmonary fibrosis. Group 4 Pulmonary hypertension that is caused by blood clots in the lungs (pulmonary emboli).  Group 5 Other causes of pulmonary hypertension, such as sickle cell anemia, or a mix of multiple causes. SYMPTOMS Symptoms of this condition include:  Shortness of breath. You may notice shortness of breath with:  Activity, such as walking.  No activity.  Tiredness and fatigue.  Dizziness or fainting.  Rapid heartbeat or feeling your heart flutter or skip a  beat (palpitations).  Neck vein enlargement.  Bluish color to your lips and fingertips. DIAGNOSIS This condition may be diagnosed by:  Chest X-ray.  Arterial blood gases. This test checks the acidity of your blood as well as your blood oxygen and carbon dioxide levels.  CT scan. This test can provide detailed images of your lungs.  Pulmonary function test. This test measures how much air your lungs can hold. It also tests how well air moves in and out of your lungs.  Electrocardiogram (ECG). This test traces the electrical activity of your heart.  Echocardiogram. This test is used to look at your heart in motion and check how it is functioning.  Heart catheterization. This test can measure the pressure in your pulmonary artery and the right side of your heart.  Lung biopsy. This procedure involves checking a sample of lung tissue to find underlying causes. TREATMENT There is no cure for pulmonary hypertension, but treatment can help to relieve symptoms and slow the progress of the condition. Treatment can involve:  Medicines, such as:  Blood pressure medicines.  Medicines to relax (dilate) the pulmonary blood vessels.  Water pills to get rid of extra fluid (diuretic medicines).  Blood-thinning medicines.  Surgery. For severe pulmonary hypertension that does not respond to medical treatment, heart-lung or lung transplant may be needed. HOME CARE INSTRUCTIONS  Take medicines only as directed by your health care provider. These include over-the-counter medicines and prescription medicines. Take all medicines exactly as instructed. Do not change or stop medicines without first checking with your health care provider.  Do not smoke. If you need help quitting, ask your health care provider.  Eat a   healthy diet.  Limit your salt (sodium) intake to less than 2,300 mg per day.  Stay as active as possible. Exercise as directed by your health care provider. Talk with your health  care provider about what type of exercise is safe for you.  Avoid high altitudes.  Avoid hot tubs and saunas.  Avoid becoming pregnant, if this applies. Talk with your health care provider about safe methods of birth control.  Keep all follow-up visits as directed by your health care provider. This is important. SEEK IMMEDIATE MEDICAL CARE IF:  You have severe shortness of breath.  You develop chest pain or pressure in your chest.  You cough up blood.  You develop swelling of your feet or legs.  You have a significant increase in weight within 1-2 days.   This information is not intended to replace advice given to you by your health care provider. Make sure you discuss any questions you have with your health care provider.   Document Released: 05/10/2007 Document Revised: 11/27/2014 Document Reviewed: 01/02/2013 [employer] Interactive Patient Education 2016 [employer]  

## 2016-03-16 ENCOUNTER — Emergency Department
Admission: EM | Admit: 2016-03-16 | Discharge: 2016-03-16 | Disposition: A | Discharge status: Home / Self Care | Payer: Medicare Other | Attending: Emergency Medicine | Admitting: Emergency Medicine

## 2016-03-16 ENCOUNTER — Emergency Department: Payer: Medicare Other

## 2016-03-16 ENCOUNTER — Encounter: Payer: Self-pay | Admitting: Emergency Medicine

## 2016-03-16 DIAGNOSIS — D329 Benign neoplasm of meninges, unspecified: Secondary | ICD-10-CM | POA: Diagnosis not present

## 2016-03-16 DIAGNOSIS — I10 Essential (primary) hypertension: Secondary | ICD-10-CM | POA: Diagnosis not present

## 2016-03-16 DIAGNOSIS — Z79899 Other long term (current) drug therapy: Secondary | ICD-10-CM | POA: Diagnosis not present

## 2016-03-16 DIAGNOSIS — Z87891 Personal history of nicotine dependence: Secondary | ICD-10-CM | POA: Diagnosis not present

## 2016-03-16 DIAGNOSIS — R0602 Shortness of breath: Secondary | ICD-10-CM | POA: Diagnosis present

## 2016-03-16 DIAGNOSIS — Z7901 Long term (current) use of anticoagulants: Secondary | ICD-10-CM | POA: Insufficient documentation

## 2016-03-16 LAB — URINALYSIS COMPLETE WITH MICROSCOPIC (ARMC ONLY)
Bacteria, UA: NONE SEEN
Bilirubin Urine: NEGATIVE
Glucose, UA: NEGATIVE mg/dL
HGB URINE DIPSTICK: NEGATIVE
KETONES UR: NEGATIVE mg/dL
Leukocytes, UA: NEGATIVE
NITRITE: NEGATIVE
PROTEIN: NEGATIVE mg/dL
SPECIFIC GRAVITY, URINE: 1.004 — AB (ref 1.005–1.030)
pH: 8 (ref 5.0–8.0)

## 2016-03-16 LAB — TROPONIN I

## 2016-03-16 LAB — BASIC METABOLIC PANEL
Anion gap: 6 (ref 5–15)
BUN: 19 mg/dL (ref 6–20)
CHLORIDE: 106 mmol/L (ref 101–111)
CO2: 27 mmol/L (ref 22–32)
Calcium: 10 mg/dL (ref 8.9–10.3)
Creatinine, Ser: 0.54 mg/dL (ref 0.44–1.00)
GFR calc Af Amer: >60 mL/min (ref >60)
GLUCOSE: 101 mg/dL — AB (ref 65–99)
POTASSIUM: 3.8 mmol/L (ref 3.5–5.1)
Sodium: 139 mmol/L (ref 135–145)

## 2016-03-16 LAB — CBC WITH DIFFERENTIAL/PLATELET
Basophils Absolute: 0 10*3/uL (ref 0–0.1)
Basophils Relative: 0 %
EOS PCT: 2 %
Eosinophils Absolute: 0.1 10*3/uL (ref 0–0.7)
HCT: 45.2 % (ref 35.0–47.0)
Hemoglobin: 15.2 g/dL (ref 12.0–16.0)
LYMPHS ABS: 1.9 10*3/uL (ref 1.0–3.6)
LYMPHS PCT: 28 %
MCH: 29.9 pg (ref 26.0–34.0)
MCHC: 33.6 g/dL (ref 32.0–36.0)
MCV: 88.8 fL (ref 80.0–100.0)
MONO ABS: 0.6 10*3/uL (ref 0.2–0.9)
Monocytes Relative: 9 %
Neutro Abs: 4.1 10*3/uL (ref 1.4–6.5)
Neutrophils Relative %: 61 %
PLATELETS: 190 10*3/uL (ref 150–440)
RBC: 5.09 MIL/uL (ref 3.80–5.20)
RDW: 14.5 % (ref 11.5–14.5)
WBC: 6.7 10*3/uL (ref 3.6–11.0)

## 2016-03-16 NOTE — ED Provider Notes (Signed)
[facility] Emergency Department Provider Note ____________________________________________   I have reviewed the triage vital signs and the triage nursing note.  HISTORY  Chief Complaint Shortness of Breath   Historian Patient  HPI Debra Cantu is a 82 y.o. female here for evaluation for feeling generalized weakness this morning, and having nurse check her blood pressure and finding that it was elevated near 200. She states that sometimes when she wakes up she doesn't feel that well, as she uses CPAP at night and she thinks maybe her CPAP machine is not working that well. The last couple morning she's had headaches in the mornings. She has had generalized fatigue. She went ahead and did several errands this morning. She also thinks that she may have overdone it in terms of exertional activity because she was helping a friend for the past 3 days. This afternoon after she got back from a couple of air and she was still not feeling very well and I called to cancel her physical therapy appointment. She called her daughter who told her to go get her blood pressure checked, and when she went to the nurse to have her blood pressure checked and they found it was elevated and she was recommended to come to the ED for further evaluation.  She occasionally has headaches, but 3 days in a row of headaches is a little unusual. No one-sided weakness or numbness. No confusion altered mental status. No chest pain or palpitations. No dizziness or passing out.      Past Medical History:  Diagnosis Date  . Arthritis   . Enlarged heart   . Hypercholesteremia   . Hypertension   . Macular degeneration   . Osteoporosis   . Pulmonary embolism [facility])     Patient Active Problem List   Diagnosis Date Noted  . Costochondritis 07/03/2015  . Chronic diastolic heart failure ([place]) 03/18/2015  . URI (upper respiratory infection) 12/07/2014  . Pulmonary hypertension ([place]) 10/17/2014  .  Acute pulmonary embolism ([place]) 10/17/2014  . Obstructive sleep apnea 10/17/2014    Past Surgical History:  Procedure Laterality Date  . CATARACT EXTRACTION  2013  . CHOLECYSTECTOMY  2003  . HERNIA REPAIR  2004  . reconstructive foot surgery  2002  . REPLACEMENT TOTAL KNEE Right   . ruptured disc repair     T4, T5  . VAGINAL HYSTERECTOMY  1978  . VEIN LIGATION Bilateral     Prior to Admission medications   Medication Sig Start Date End Date Taking? Authorizing Provider  acetaminophen (TYLENOL) 325 MG tablet Take 650 mg by mouth every 6 (six) hours as needed.    Historical Provider, MD  amLODipine (NORVASC) 5 MG tablet Take 2.5 mg by mouth daily.    Historical Provider, MD  atorvastatin (LIPITOR) 80 MG tablet Take 80 mg by mouth daily.    Historical Provider, MD  beta carotene 15 MG capsule Take 15 mg by mouth daily.    Historical Provider, MD  carboxymethylcellulose (REFRESH PLUS) 0.5 % SOLN 1 drop 2 (two) times daily as needed.    Historical Provider, MD  cholecalciferol (VITAMIN D) 400 UNITS TABS tablet Take 400 Units by mouth daily.    Historical Provider, MD  cromolyn (NASALCROM) 5.2 MG/ACT nasal spray 1 spray by Nasal route 4 (four) times daily. 05/31/15 05/30/16  Historical Provider, MD  furosemide (LASIX) 20 MG tablet Take 1 tablet (20 mg total) by mouth daily. 03/18/15 03/17/16  Flora Lipps, MD  glucosamine-chondroitin 500-400 MG tablet  Take 1 tablet by mouth daily.    Historical Provider, MD  levothyroxine (SYNTHROID, LEVOTHROID) 88 MCG tablet Take 88 mcg by mouth daily before breakfast.    Historical Provider, MD  Melatonin 3 MG TABS Take 1 tablet by mouth at bedtime.    Historical Provider, MD  Multiple Vitamins-Minerals (PRESERVISION AREDS 2 PO) Take 1 capsule by mouth daily.    Historical Provider, MD  polyethylene glycol (MIRALAX / GLYCOLAX) packet Take 17 g by mouth daily.    Historical Provider, MD  valsartan (DIOVAN) 320 MG tablet Take 320 mg by mouth daily.    Historical  Provider, MD  warfarin (COUMADIN) 4 MG tablet Take 3.5 mg by mouth daily.     Historical Provider, MD    Allergies  Allergen Reactions  . Oxycodone     Family History  Problem Relation Age of Onset  . Heart disease Brother   . Stroke Father   . Alzheimer's disease Mother   . Cancer Sister     skin    Social History Social History  Substance Use Topics  . Smoking status: Former Smoker    Packs/day: 0.10    Years: 1.00    Types: Cigarettes    Quit date: 10/17/1954  . Smokeless tobacco: Never Used  . Alcohol use No    Review of Systems  Constitutional: Negative for fever. Eyes: Negative for visual changes. ENT: Negative for sore throat. Cardiovascular: Negative for chest pain. Respiratory: Negative for shortness of breath. Gastrointestinal: Negative for abdominal pain, vomiting and diarrhea. Genitourinary: Negative for dysuria. Musculoskeletal: Negative for back pain. Skin: Negative for rash. Neurological:Mild global headache as per history of present illness, very mild currently 10 point Review of Systems otherwise negative ____________________________________________   PHYSICAL EXAM:  VITAL SIGNS: ED Triage Vitals  Enc Vitals Group     BP 03/16/16 1218 (!) 189/99     Pulse Rate 03/16/16 1218 76     Resp 03/16/16 1218 16     Temp 03/16/16 1218 98 F (36.7 C)     Temp Source 03/16/16 1218 Oral     SpO2 03/16/16 1218 99 %     Weight 03/16/16 1214 185 lb (83.9 kg)     Height 03/16/16 1214 5' (1.524 m)     Head Circumference --      Peak Flow --      Pain Score 03/16/16 1215 5     Pain Loc --      Pain Edu? --      Excl. in [place]? --      Constitutional: Alert and oriented. Well appearing and in no distress. HEENT   Head: Normocephalic and atraumatic.      Eyes: Conjunctivae are normal. PERRL. Normal extraocular movements.      Ears:         Nose: No congestion/rhinnorhea.   Mouth/Throat: Mucous membranes are moist.   Neck: No  stridor. Cardiovascular/Chest: Normal rate, regular rhythm.  No murmurs, rubs, or gallops. Respiratory: Normal respiratory effort without tachypnea nor retractions. Breath sounds are clear and equal bilaterally. No wheezes/rales/rhonchi. Gastrointestinal: Soft. No distention, no guarding, no rebound. Nontender.    Genitourinary/rectal:Deferred Musculoskeletal: Nontender with normal range of motion in all extremities. No joint effusions.  No lower extremity tenderness.  No edema. Neurologic:  Normal speech and language. No gross or focal neurologic deficits are appreciated. Skin:  Skin is warm, dry and intact. No rash noted. Psychiatric: Mood and affect are normal. Speech and behavior are normal. Patient  exhibits appropriate insight and judgment.  ____________________________________________   EKG I, Lisa Roca, MD, the attending physician have personally viewed and interpreted all ECGs.  71 bpm. Narrow QRS. Right axis. Nonspecific T waves. ____________________________________________  LABS (pertinent positives/negatives)  Labs Reviewed  URINALYSIS COMPLETEWITH MICROSCOPIC ([facility] ONLY) - Abnormal; Notable for the following:       Result Value   Color, Urine COLORLESS (*)    APPearance CLEAR (*)    Specific Gravity, Urine 1.004 (*)    Squamous Epithelial / LPF 0-5 (*)    All other components within normal limits  BASIC METABOLIC PANEL - Abnormal; Notable for the following:    Glucose, Bld 101 (*)    All other components within normal limits  TROPONIN I  CBC WITH DIFFERENTIAL/PLATELET    ____________________________________________  RADIOLOGY All Xrays were viewed by me. Imaging interpreted by Radiologist.  CT head without contrast:  IMPRESSION: Interval enlargement of known left extra-axial calcified mass, consistent with enlarging meningioma, which exerts mass effect on the underlying brain parenchyma, and causes minimal rightward midline shift.  No evidence of acute  infarction or edema.  CXR:    CLINICAL DATA: Increased shortness of breath today.  EXAM: CHEST 2 VIEW  COMPARISON: 12/07/2014 and 10/23/2014  FINDINGS: There is tortuosity of the thoracic aorta and of the brachiocephalic vessels. Calcification in the arch of the aorta. Chronic prominence of pulmonary arteries, left more than right. No infiltrates or effusions. No acute bone abnormality. 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  Vague density seen on the lateral view overlying the ascending thoracic aorta is felt to most likely represent an overlap of vessels and soft tissues.  IMPRESSION: Vague area of increased density anteriorly on the lateral view which is most likely an overlap of soft tissues but I cannot exclude a small area of infiltrate or a nodule.  Aortic atherosclerosis.      __________________________________________  PROCEDURES  Procedure(s) performed: None  Critical Care performed: None  ____________________________________________   ED COURSE / ASSESSMENT AND PLAN  Pertinent labs & imaging results that were available during my care of the patient were reviewed by me and considered in my medical decision making (see chart for details).   Ms. Googe is here for generalized fatigue and headaches in the morning for the past 3 days, and elevated blood pressure found this afternoon on nurse visit.  On exam she has a nonfocal exam, and by history no certain cause most likely.   CT head shows Meningioma enlarged from last in May which was an MRI from 2012, and now exerting a small amount of mass effect.  Given the fact that the patient knows about this previous meningioma, and she is currently not having neurologic symptoms, and headache is resolved, I don't think that she needs emergency evaluation.  I spoke with the on call neurosurgeon, Dr. Butler Denmark, who recommends no additional emergency eval or treatment given asymptomatic at present, but needs urgent eval  this week and set patient up with appointment at [place] on Friday.    CONSULTATIONS:   [facility] neurosurgery by phone, Dr. Army Melia.   Patient / Family / Caregiver informed of clinical course, medical decision-making process, and agree with plan.   I discussed return precautions, follow-up instructions, and discharged instructions with patient and/or family.   ___________________________________________   FINAL CLINICAL IMPRESSION(S) / ED DIAGNOSES   Final diagnoses:  Meningioma ([place])              Note: This dictation was prepared with  Sales executive. Any transcriptional errors that result from this process are unintentional    Lisa Roca, MD 03/16/16 1659

## 2016-03-16 NOTE — Discharge Instructions (Signed)
You were evaluated for generalized fatigue, several days of headaches in the morning, and elevated blood pressure today which is now resolved. Your blood work and exam are reassuring in the emergency department stay. As we discussed your CT scan shows the meningioma, noncancerous mass, has enlarged in size and is pushing slightly on the brain. Because of this, you do need to follow up this week with neurosurgery at [facility], as scheduled for 8 AM on Friday. Return to the emergency department immediately for new or worsening headache, vomiting, one-sided weakness or numbness, confusion altered mental status, lethargy, dizziness or off balance, or any other symptoms concerning to you.

## 2016-03-16 NOTE — ED Triage Notes (Signed)
Pt to ED via EMS from [place] [facility]. Pt was seen in clinic today for headache xfew days and SOB. Pt HTN 201/108, 98% on RA, 74 HR en route. Pt denies any CP at this time. Pt c/o some lightheadness, pt states she is on C-PAP at night. A&Ox4

## 2016-03-16 NOTE — ED Notes (Signed)
Gave pt a blanket

## 2016-03-24 ENCOUNTER — Telehealth: Payer: Self-pay | Admitting: Internal Medicine

## 2016-03-24 NOTE — Telephone Encounter (Signed)
Pt states she spoke with [employer], who were able to answer all of her questions. Pt had no further questions or concerns for [place]. Nothing further needed.

## 2016-03-24 NOTE — Telephone Encounter (Signed)
Margarita Grizzle, pt's daughter is calling with some concerns about patient's CPAP machine, pressure and the humidity (as to what that should be set on).  Please contact daughter to discuss these issues. Rhonda J Cobb

## 2016-03-28 ENCOUNTER — Emergency Department: Payer: Medicare Other

## 2016-03-28 ENCOUNTER — Encounter: Payer: Self-pay | Admitting: Emergency Medicine

## 2016-03-28 ENCOUNTER — Emergency Department
Admission: EM | Admit: 2016-03-28 | Discharge: 2016-03-28 | Disposition: A | Discharge status: Home / Self Care | Payer: Medicare Other | Attending: Emergency Medicine | Admitting: Emergency Medicine

## 2016-03-28 DIAGNOSIS — Z87891 Personal history of nicotine dependence: Secondary | ICD-10-CM | POA: Diagnosis not present

## 2016-03-28 DIAGNOSIS — I5032 Chronic diastolic (congestive) heart failure: Secondary | ICD-10-CM | POA: Insufficient documentation

## 2016-03-28 DIAGNOSIS — F419 Anxiety disorder, unspecified: Secondary | ICD-10-CM | POA: Diagnosis not present

## 2016-03-28 DIAGNOSIS — R51 Headache: Secondary | ICD-10-CM | POA: Diagnosis not present

## 2016-03-28 DIAGNOSIS — I110 Hypertensive heart disease with heart failure: Secondary | ICD-10-CM | POA: Diagnosis not present

## 2016-03-28 DIAGNOSIS — R519 Headache, unspecified: Secondary | ICD-10-CM

## 2016-03-28 LAB — CBC WITH DIFFERENTIAL/PLATELET
BASOS ABS: 0 10*3/uL (ref 0–0.1)
Basophils Relative: 0 %
EOS ABS: 0.1 10*3/uL (ref 0–0.7)
EOS PCT: 1 %
HCT: 45.7 % (ref 35.0–47.0)
Hemoglobin: 15.8 g/dL (ref 12.0–16.0)
Lymphocytes Relative: 31 %
Lymphs Abs: 2.2 10*3/uL (ref 1.0–3.6)
MCH: 30.6 pg (ref 26.0–34.0)
MCHC: 34.5 g/dL (ref 32.0–36.0)
MCV: 88.6 fL (ref 80.0–100.0)
MONO ABS: 0.6 10*3/uL (ref 0.2–0.9)
Monocytes Relative: 9 %
Neutro Abs: 4.1 10*3/uL (ref 1.4–6.5)
Neutrophils Relative %: 59 %
PLATELETS: 203 10*3/uL (ref 150–440)
RBC: 5.16 MIL/uL (ref 3.80–5.20)
RDW: 14.5 % (ref 11.5–14.5)
WBC: 7 10*3/uL (ref 3.6–11.0)

## 2016-03-28 LAB — COMPREHENSIVE METABOLIC PANEL
ALT: 31 U/L (ref 14–54)
AST: 29 U/L (ref 15–41)
Albumin: 4.2 g/dL (ref 3.5–5.0)
Alkaline Phosphatase: 90 U/L (ref 38–126)
Anion gap: 4 — ABNORMAL LOW (ref 5–15)
BUN: 19 mg/dL (ref 6–20)
CHLORIDE: 105 mmol/L (ref 101–111)
CO2: 30 mmol/L (ref 22–32)
CREATININE: 0.99 mg/dL (ref 0.44–1.00)
Calcium: 9.9 mg/dL (ref 8.9–10.3)
GFR, EST AFRICAN AMERICAN: 59 mL/min — AB (ref >60)
GFR, EST NON AFRICAN AMERICAN: 51 mL/min — AB (ref >60)
Glucose, Bld: 108 mg/dL — ABNORMAL HIGH (ref 65–99)
POTASSIUM: 4.4 mmol/L (ref 3.5–5.1)
SODIUM: 139 mmol/L (ref 135–145)
Total Bilirubin: 0.6 mg/dL (ref 0.3–1.2)
Total Protein: 7.1 g/dL (ref 6.5–8.1)

## 2016-03-28 LAB — PROTIME-INR
INR: 2.09
PROTHROMBIN TIME: 23.8 s — AB (ref 11.4–15.2)

## 2016-03-28 MED ORDER — LORAZEPAM 0.5 MG PO TABS
0.5000 mg | ORAL_TABLET | Freq: Once | ORAL | Status: AC
  Administered 2016-03-28: 0.5 mg via ORAL
  Filled 2016-03-28: qty 1

## 2016-03-28 MED ORDER — LORAZEPAM 0.5 MG PO TABS: 0.5000 mg | ORAL_TABLET | Freq: Three times a day (TID) | ORAL | 0 refills | Status: DC | PRN

## 2016-03-28 NOTE — ED Triage Notes (Signed)
Intermittent headaches x 2 weeks, mainly R side. Also monitoring blood pressure and is concerned it raised today to 140/98. Grips equal, speech clear.

## 2016-03-28 NOTE — ED Provider Notes (Signed)
[facility] Emergency Department Provider Note        Time seen: ----------------------------------------- 4:48 PM on 03/28/2016 -----------------------------------------    I have reviewed the triage vital signs and the nursing notes.   HISTORY  Chief Complaint Headache    HPI Debra Cantu is a 82 y.o. female who presents to ER for intermittent headaches for 2 weeks minimally on the right side. Patient states nothing is making them better or worse. She was concerned because her blood pressure was elevated at 140/98. She has not had neurologic symptoms, has chronic seizures and left leg. Patient denies recent illness, no she has been under a lot of stress and recently stress has caused her headaches. Her daughter is getting a divorce after many years and she states she is also having some health problems due to aging. She denies other complaints.   Past Medical History:  Diagnosis Date  . Arthritis   . Enlarged heart   . Hypercholesteremia   . Hypertension   . Macular degeneration   . Osteoporosis   . Pulmonary embolism [facility])     Patient Active Problem List   Diagnosis Date Noted  . Costochondritis 07/03/2015  . Chronic diastolic heart failure ([place]) 03/18/2015  . URI (upper respiratory infection) 12/07/2014  . Pulmonary hypertension ([place]) 10/17/2014  . Acute pulmonary embolism ([place]) 10/17/2014  . Obstructive sleep apnea 10/17/2014    Past Surgical History:  Procedure Laterality Date  . CATARACT EXTRACTION  2013  . CHOLECYSTECTOMY  2003  . HERNIA REPAIR  2004  . reconstructive foot surgery  2002  . REPLACEMENT TOTAL KNEE Right   . ruptured disc repair     T4, T5  . VAGINAL HYSTERECTOMY  1978  . VEIN LIGATION Bilateral     Allergies Oxycodone  Social History Social History  Substance Use Topics  . Smoking status: Former Smoker    Packs/day: 0.10    Years: 1.00    Types: Cigarettes    Quit date: 10/17/1954  . Smokeless tobacco:  Never Used  . Alcohol use No    Review of Systems Constitutional: Negative for fever. Cardiovascular: Negative for chest pain. Respiratory: Negative for shortness of breath. Gastrointestinal: Negative for abdominal pain, vomiting and diarrhea. Genitourinary: Negative for dysuria. Musculoskeletal: Negative for back pain. Skin: Negative for rash. Neurological: Positive for headache Psychiatric: Positive for anxiety  10-point ROS otherwise negative.  ____________________________________________   PHYSICAL EXAM:  VITAL SIGNS: ED Triage Vitals  Enc Vitals Group     BP 03/28/16 1429 (!) 167/81     Pulse Rate 03/28/16 1429 80     Resp 03/28/16 1429 18     Temp 03/28/16 1429 97.8 F (36.6 C)     Temp Source 03/28/16 1429 Oral     SpO2 03/28/16 1429 97 %     Weight 03/28/16 1429 184 lb (83.5 kg)     Height 03/28/16 1429 5' (1.524 m)     Head Circumference --      Peak Flow --      Pain Score 03/28/16 1430 8     Pain Loc --      Pain Edu? --      Excl. in [place]? --     Constitutional: Alert and oriented. Anxious, no acute distress Eyes: Conjunctivae are normal. PERRL. Normal extraocular movements. ENT   Head: Normocephalic and atraumatic.   Nose: No congestion/rhinnorhea.   Mouth/Throat: Mucous membranes are moist.   Neck: No stridor. Cardiovascular: Normal rate, regular rhythm.  No murmurs, rubs, or gallops. Respiratory: Normal respiratory effort without tachypnea nor retractions. Breath sounds are clear and equal bilaterally. No wheezes/rales/rhonchi. Gastrointestinal: Soft and nontender. Normal bowel sounds Musculoskeletal: Nontender with normal range of motion in all extremities. No lower extremity tenderness nor edema. Neurologic:  Normal speech and language. No gross focal neurologic deficits are appreciated.  Skin:  Skin is warm, dry and intact. No rash noted. Psychiatric: Patient is anxious, speech and behavior are  normal ____________________________________________  ED COURSE:  Pertinent labs & imaging results that were available during my care of the patient were reviewed by me and considered in my medical decision making (see chart for details). Clinical Course  Patient presents to ER after having headaches. We will reassess with labs and imaging.  Procedures ____________________________________________   LABS (pertinent positives/negatives)  Labs Reviewed  COMPREHENSIVE METABOLIC PANEL - Abnormal; Notable for the following:       Result Value   Glucose, Bld 108 (*)    GFR calc non Af Amer 51 (*)    GFR calc Af Amer 59 (*)    Anion gap 4 (*)    All other components within normal limits  PROTIME-INR - Abnormal; Notable for the following:    Prothrombin Time 23.8 (*)    All other components within normal limits  CBC WITH DIFFERENTIAL/PLATELET    RADIOLOGY Images were viewed by me  IMPRESSION: No evidence of acute intracranial abnormality.  Atrophy, mild chronic small-vessel white matter ischemic changes and stable densely calcified left frontoparietal extra-axial mass/meningioma.   ____________________________________________  FINAL ASSESSMENT AND PLAN  Headache, anxiety  Plan: Patient with labs and imaging as dictated above. Patient is in no distress, no change in her CT that was performed recently. She's been seen by a neurosurgeon and nonoperative management was recommended. Her headache seems to be anxiety related, will prescribe Ativan to take as needed. She was given a dose here with improvement in her symptoms.   [name], MD   Note: This dictation was prepared with Dragon dictation. Any transcriptional errors that result from this process are unintentional    [name], MD 03/28/16 1651

## 2016-04-02 ENCOUNTER — Ambulatory Visit (INDEPENDENT_AMBULATORY_CARE_PROVIDER_SITE_OTHER): Payer: Medicare Other | Admitting: Internal Medicine

## 2016-04-02 ENCOUNTER — Encounter: Payer: Self-pay | Admitting: Internal Medicine

## 2016-04-02 VITALS — BP 136/90 | HR 84 | Ht 60.0 in | Wt 187.0 lb

## 2016-04-02 DIAGNOSIS — G4733 Obstructive sleep apnea (adult) (pediatric): Secondary | ICD-10-CM | POA: Diagnosis not present

## 2016-04-02 DIAGNOSIS — Z9989 Dependence on other enabling machines and devices: Principal | ICD-10-CM

## 2016-04-02 NOTE — Patient Instructions (Signed)
Continue CPAP Continue oxygen at night  Sleep Apnea  Sleep apnea is a sleep disorder characterized by abnormal pauses in breathing while you sleep. When your breathing pauses, the level of oxygen in your blood decreases. This causes you to move out of deep sleep and into light sleep. As a result, your quality of sleep is poor, and the system that carries your blood throughout your body (cardiovascular system) experiences stress. If sleep apnea remains untreated, the following conditions can develop:  High blood pressure (hypertension).  Coronary artery disease.  Inability to achieve or maintain an erection (impotence).  Impairment of your thought process (cognitive dysfunction). There are three types of sleep apnea: 1. Obstructive sleep apnea--Pauses in breathing during sleep because of a blocked airway. 2. Central sleep apnea--Pauses in breathing during sleep because the area of the brain that controls your breathing does not send the correct signals to the muscles that control breathing. 3. Mixed sleep apnea--A combination of both obstructive and central sleep apnea. RISK FACTORS The following risk factors can increase your risk of developing sleep apnea:  Being overweight.  Smoking.  Having narrow passages in your nose and throat.  Being of older age.  Being female.  Alcohol use.  Sedative and tranquilizer use.  Ethnicity. Among individuals younger than 35 years, African Americans are at increased risk of sleep apnea. SYMPTOMS   Difficulty staying asleep.  Daytime sleepiness and fatigue.  Loss of energy.  Irritability.  Loud, heavy snoring.  Morning headaches.  Trouble concentrating.  Forgetfulness.  Decreased interest in sex.  Unexplained sleepiness. DIAGNOSIS  In order to diagnose sleep apnea, your caregiver will perform a physical examination. A sleep study done in the comfort of your own home may be appropriate if you are otherwise healthy. Your caregiver  may also recommend that you spend the night in a sleep lab. In the sleep lab, several monitors record information about your heart, lungs, and brain while you sleep. Your leg and arm movements and blood oxygen level are also recorded. TREATMENT The following actions may help to resolve mild sleep apnea:  Sleeping on your side.   Using a decongestant if you have nasal congestion.   Avoiding the use of depressants, including alcohol, sedatives, and narcotics.   Losing weight and modifying your diet if you are overweight. There also are devices and treatments to help open your airway:  Oral appliances. These are custom-made mouthpieces that shift your lower jaw forward and slightly open your bite. This opens your airway.  Devices that create positive airway pressure. This positive pressure "splints" your airway open to help you breathe better during sleep. The following devices create positive airway pressure:  Continuous positive airway pressure (CPAP) device. The CPAP device creates a continuous level of air pressure with an air pump. The air is delivered to your airway through a mask while you sleep. This continuous pressure keeps your airway open.  Nasal expiratory positive airway pressure (EPAP) device. The EPAP device creates positive air pressure as you exhale. The device consists of single-use valves, which are inserted into each nostril and held in place by adhesive. The valves create very little resistance when you inhale but create much more resistance when you exhale. That increased resistance creates the positive airway pressure. This positive pressure while you exhale keeps your airway open, making it easier to breath when you inhale again.  Bilevel positive airway pressure (BPAP) device. The BPAP device is used mainly in patients with central sleep apnea. This device  is similar to the CPAP device because it also uses an air pump to deliver continuous air pressure through a mask.  However, with the BPAP machine, the pressure is set at two different levels. The pressure when you exhale is lower than the pressure when you inhale.  Surgery. Typically, surgery is only done if you cannot comply with less invasive treatments or if the less invasive treatments do not improve your condition. Surgery involves removing excess tissue in your airway to create a wider passage way.   This information is not intended to replace advice given to you by your health care provider. Make sure you discuss any questions you have with your health care provider.   Document Released: 07/03/2002 Document Revised: 08/03/2014 Document Reviewed: 11/19/2011 Elsevier Interactive Patient Education [employer].

## 2016-04-02 NOTE — Progress Notes (Signed)
Date: 04/02/2016,   MRN# [id number] Debra Cantu 1927-12-17 Code Status:  Hosp day:@LENGTHOFSTAYDAYS @ Referring MD: @ATDPROV @     PCP:      Admission                  Current  Debra Cantu is a 82 y.o. old female seen in consultation for SOb and Sleep apnea   Synopsis: patient with presumed PULM HTN with chronic hypoxic resp failure with OSA L Frontal Meningioma   CHIEF COMPLAINT:   Follow up OSA   HISTORY OF PRESENT ILLNESS   Patient states that she is feeling good, her sleep is much better  no acute SOB, cough, no wheezing. Patient sitting comfortably, no resp distress No signs of infection at this time Lower ext swelling has improved Doing well overall      Current Medication:  Current Outpatient Prescriptions:  .  acetaminophen (TYLENOL) 325 MG tablet, Take 650 mg by mouth every 6 (six) hours as needed., Disp: , Rfl:  .  amLODipine (NORVASC) 5 MG tablet, Take 2.5 mg by mouth daily., Disp: , Rfl:  .  atorvastatin (LIPITOR) 80 MG tablet, Take 80 mg by mouth daily., Disp: , Rfl:  .  beta carotene 15 MG capsule, Take 15 mg by mouth daily., Disp: , Rfl:  .  carboxymethylcellulose (REFRESH PLUS) 0.5 % SOLN, 1 drop 2 (two) times daily as needed., Disp: , Rfl:  .  cholecalciferol (VITAMIN D) 400 UNITS TABS tablet, Take 400 Units by mouth daily., Disp: , Rfl:  .  cromolyn (NASALCROM) 5.2 MG/ACT nasal spray, 1 spray by Nasal route 4 (four) times daily., Disp: , Rfl:  .  furosemide (LASIX) 20 MG tablet, Take 1 tablet (20 mg total) by mouth daily., Disp: 30 tablet, Rfl: 11 .  glucosamine-chondroitin 500-400 MG tablet, Take 1 tablet by mouth daily., Disp: , Rfl:  .  levothyroxine (SYNTHROID, LEVOTHROID) 88 MCG tablet, Take 88 mcg by mouth daily before breakfast., Disp: , Rfl:  .  LORazepam (ATIVAN) 0.5 MG tablet, Take 1 tablet (0.5 mg total) by mouth every 8 (eight) hours as needed for anxiety., Disp: 30 tablet, Rfl: 0 .  Melatonin 3 MG TABS, Take 1 tablet by mouth at bedtime.,  Disp: , Rfl:  .  Multiple Vitamins-Minerals (PRESERVISION AREDS 2 PO), Take 1 capsule by mouth daily., Disp: , Rfl:  .  polyethylene glycol (MIRALAX / GLYCOLAX) packet, Take 17 g by mouth daily., Disp: , Rfl:  .  valsartan (DIOVAN) 320 MG tablet, Take 320 mg by mouth daily., Disp: , Rfl:  .  warfarin (COUMADIN) 4 MG tablet, Take 3.5 mg by mouth daily. , Disp: , Rfl:     ALLERGIES   Oxycodone     REVIEW OF SYSTEMS   Review of Systems  Constitutional: Negative for chills, fever and malaise/fatigue.  HENT: Negative for congestion.   Respiratory: Negative for cough, hemoptysis, sputum production, shortness of breath and wheezing.   Cardiovascular: Negative for chest pain, palpitations, orthopnea and leg swelling.  Gastrointestinal: Negative for abdominal pain, heartburn, nausea and vomiting.  Musculoskeletal: Positive for back pain.  Neurological: Negative for weakness.  Endo/Heme/Allergies: Negative.   Psychiatric/Behavioral: Negative.   All other systems reviewed and are negative.   BP 136/90 (BP Location: Left Arm, Cuff Size: Normal)   Pulse 84   Ht 5' (1.524 m)   Wt 187 lb (84.8 kg)   SpO2 97%   BMI 36.52 kg/m      PHYSICAL EXAM  Physical Exam  Constitutional: She is oriented to person, place, and time. No distress.  Cardiovascular: Normal rate, regular rhythm and normal heart sounds.   No murmur heard. Pulmonary/Chest: Effort normal and breath sounds normal. No respiratory distress. She has no wheezes.  Musculoskeletal: Normal range of motion. She exhibits edema.  Neurological: She is alert and oriented to person, place, and time. No cranial nerve deficit.  Skin: Skin is warm. She is not diaphoretic.  Psychiatric: She has a normal mood and affect.        ASSESSMENT/PLAN    82 yo white female with presumed Pulmonary HTN with h/o PE along with signs and symptoms of Diastolic heart failure with chronic hypoxic resp failure with OSA and L Frontal  Meningioma  OSA with Chronic hypoxic resp failure -continue CPAP as prescribed -continue oxygen at night  Pulmonary hypertension with fatigue She has evidence of pulmonary hypertension on an echocardiogram but she has not undergone a right heart catheterization- -I would not recommend cath at this point -continue CPAP as presribed-Follow up with Dr Ashby Dawes to assess her OSA.  -continue oxygen therapy as night   Diastolic Heart failure-euvolumic state at this time -she is now taking lasix as needed -recommend follow up with cardiology as needed   Anxiety -Klonipin  0.5 mg.  Meningioma-not a surgical case at this time -follow up neurology as scheduled  Follow up 6 months  The Patient requires high complexity decision making for assessment and support, frequent evaluation and titration of therapies, application of advanced monitoring technologies and extensive interpretation of multiple databases.   Patient is satisfied with Plan of action and management.    [name], M.D.  [facility]  Medical Director [place] Director [facility] Cardio-Pulmonary Department

## 2016-06-17 ENCOUNTER — Telehealth: Payer: Self-pay | Admitting: Internal Medicine

## 2016-06-17 NOTE — Telephone Encounter (Signed)
Informed daughter the settings of the CPAP and O2. Gave daughter number to [facility] for them to call for any further questions.

## 2016-06-17 NOTE — Telephone Encounter (Signed)
Pt daughter called, states she thinks pt dial has been switched on pt CPAP machine and oxygen. Please call to verify what levels should be.

## 2016-07-08 ENCOUNTER — Encounter: Payer: Self-pay | Admitting: Internal Medicine

## 2016-07-08 NOTE — Progress Notes (Signed)
* [facility]     Assessment and Plan:  Headache --Try stopping the nasal spray for one week.  --If not successful then try stopping the cpap for 3-4 days to see if the headache goes away.   Obstructive sleep apnea. -Review the patient's most recent sleep study shows that her sleep apnea adequately treated at a pressure level of 10. -Reminded her today that she can get new supplies every 3-6 months.  Periodic limb movements sleep, with arousals. -Ferritin level was normal. Likely secondary to obstructive sleep apnea.  Insomnia, sleep maintenance type. -This appears improved since being on her cpap every night.  -She notes she has been on Ambien for several years in the past and felt that she became dependent upon it, then was on klonopin, but no longer using it.   Chronic rhinitis. -Will give her script for cromolyn nasal spray, which she is currently using and feels that it helps.   Meningioma --with chronic headache, she was seen by a neurosurgeon at [facility] and was told that this was not the cause of her headache.      Date: 07/08/2016  MRN# [id number] Debra Cantu September 22, 1927   Debra Cantu is a 82 y.o. old female seen in follow up for chief complaint of  Chief Complaint  Patient presents with  . Follow-up    1y rov. pt c/o waking with headaches, pt feels this may be caused by her cpap pressure being too strong.  pt wearing cpap avg 8hr nightly.      HPI:   The patient is a 82 year old female with a history of pulmonary hypertension, diastolic dysfunction. She normally sees Dr. Mortimer Fries, for these problems. I have also seen her for her history of obstructive sleep apnea, restless leg syndrome, periodic limb movements in sleep.  At last visit, she was transitioned from [place] to [place], started on Rhinocort, asked to use her CPAP for the entire night, and had a ferritin level checked which was normal, in addition to a CPAP auto titrating test. She  notes that she is a bit more awake since doing auto-PAP.   She has been having headaches, she has a diagnosis of meningioma but was told that the meningioma was not the cause of the headache, and in any case the meningioma could not removed. She has since been weaned off of lexapro and notes that the headaches have improved, no longer occurring every morning. She continues to have the pressure sensation at the top of the head. She wakes up with the headache in the am. She uses a cromolyn nasal spray every night and morning, she is not sure that it helps. She notes that the headache can make her feel nauseated.   She notes that the on her machine nozzle is broken and is wondering if she can buy her own machine.    Review and summary of testing: Ferritin level: 51, this is normal, and is likely noncontributory to the patient's PLMS. --auto Pap titration completed 07/09/2015: The patient's average device pressure was 9.47 m water residual AHI was 1.5. --Overnight oximetry 09/13/2014: Nocturnal oximetry performed while wearing CPAP and 2 L of oxygen showed minimal desaturations with adequate oxygenation overnight.  SIX MIN WALK 12/31/2015 07/03/2015 04/09/2015  Supplimental Oxygen during Test? (L/min) No No No  Tech Comments: - - pt walked at her normal pace while talking.    Medication:   Outpatient Encounter Prescriptions as of 07/09/2016  Medication Sig  . acetaminophen (TYLENOL) 325 MG  tablet Take 650 mg by mouth every 6 (six) hours as needed.  Marland Kitchen amLODipine (NORVASC) 5 MG tablet Take 2.5 mg by mouth daily.  Marland Kitchen atorvastatin (LIPITOR) 80 MG tablet Take 80 mg by mouth daily.  . beta carotene 15 MG capsule Take 15 mg by mouth daily.  . busPIRone (BUSPAR) 15 MG tablet Take by mouth.  . carboxymethylcellulose (REFRESH PLUS) 0.5 % SOLN 1 drop 2 (two) times daily as needed.  . cholecalciferol (VITAMIN D) 400 UNITS TABS tablet Take 400 Units by mouth daily.  . cromolyn (NASALCROM) 5.2 MG/ACT nasal spray  1 spray by Nasal route 4 (four) times daily.  . furosemide (LASIX) 20 MG tablet Take 1 tablet (20 mg total) by mouth daily.  Marland Kitchen glucosamine-chondroitin 500-400 MG tablet Take 1 tablet by mouth daily.  Marland Kitchen levothyroxine (SYNTHROID, LEVOTHROID) 88 MCG tablet Take 88 mcg by mouth daily before breakfast.  . LORazepam (ATIVAN) 0.5 MG tablet Take 1 tablet (0.5 mg total) by mouth every 8 (eight) hours as needed for anxiety.  . Melatonin 3 MG TABS Take 1 tablet by mouth at bedtime.  . Multiple Vitamins-Minerals (PRESERVISION AREDS 2 PO) Take 1 capsule by mouth daily.  . polyethylene glycol (MIRALAX / GLYCOLAX) packet Take 17 g by mouth daily.  . valsartan (DIOVAN) 320 MG tablet Take 320 mg by mouth daily.  Marland Kitchen warfarin (COUMADIN) 4 MG tablet Take 3.5 mg by mouth daily.    No facility-administered encounter medications on file as of 07/09/2016.      Allergies:  Oxycodone  Review of Systems: Gen:  Denies  fever, sweats. HEENT: Denies blurred vision. Cvc:  No dizziness, chest pain or heaviness Resp:   Denies cough or sputum porduction. Gi: Denies swallowing difficulty, stomach pain.  Gu:  Denies bladder incontinence, burning urine Ext:   No Joint pain, stiffness. Skin: No skin rash, easy bruising. Endoc:  No polyuria, polydipsia. Psych: No depression, insomnia. Other:  All other systems were reviewed and found to be negative other than what is mentioned in the HPI.   Physical Examination:   VS: BP 136/78 (BP Location: Left Arm, Cuff Size: Normal)   Pulse 76   Ht 4\' 11"  (1.499 m)   Wt 85.3 kg (188 lb)   SpO2 95%   BMI 37.97 kg/m   General Appearance: No distress  Neuro:without focal findings,  speech normal,  HEENT: PERRLA, EOM intact. Pulmonary: normal breath sounds, No wheezing.   CardiovascularNormal S1,S2.  No m/r/g.   Abdomen: Benign, Soft, non-tender. Renal:  No costovertebral tenderness  GU:  Not performed at this time. Endoc: No evident thyromegaly, no signs of  acromegaly. Skin:   warm, no rash. Extremities: normal, no cyanosis, clubbing.   LABORATORY PANEL:   CBC No results for input(s): WBC, HGB, HCT, PLT in the last 168 hours. ------------------------------------------------------------------------------------------------------------------  Chemistries  No results for input(s): NA, K, CL, CO2, GLUCOSE, BUN, CREATININE, CALCIUM, MG, AST, ALT, ALKPHOS, BILITOT in the last 168 hours.  Invalid input(s): GFRCGP ------------------------------------------------------------------------------------------------------------------  Cardiac Enzymes No results for input(s): TROPONINI in the last 168 hours. ------------------------------------------------------------  RADIOLOGY:   No results found for this or any previous visit. Results for orders placed in visit on 12/17/14  DG Chest 2 View   ------------------------------------------------------------------------------------------------------------------  Thank  you for allowing [facility] to assist in the care of your patient. Our recommendations are noted above.  Please contact us if we can be of further service.   Marda Stalker, MD.  [facility] Office Number: [phone]  Patricia Pesa, M.D.  Vilinda Boehringer, M.D.  Merton Border, M.D

## 2016-07-09 ENCOUNTER — Encounter: Payer: Self-pay | Admitting: Internal Medicine

## 2016-07-09 ENCOUNTER — Ambulatory Visit (INDEPENDENT_AMBULATORY_CARE_PROVIDER_SITE_OTHER): Payer: Medicare Other | Admitting: Internal Medicine

## 2016-07-09 VITALS — BP 136/78 | HR 76 | Ht 59.0 in | Wt 188.0 lb

## 2016-07-09 DIAGNOSIS — G4733 Obstructive sleep apnea (adult) (pediatric): Secondary | ICD-10-CM | POA: Diagnosis not present

## 2016-07-09 DIAGNOSIS — J3089 Other allergic rhinitis: Secondary | ICD-10-CM | POA: Diagnosis not present

## 2016-07-09 DIAGNOSIS — Z9989 Dependence on other enabling machines and devices: Secondary | ICD-10-CM | POA: Diagnosis not present

## 2016-07-09 NOTE — Patient Instructions (Addendum)
--  Try stopping the nasal spray for one week.   --If not successful then also try stopping the cpap for 3-4 days to see if the headache goes away.   --Will need to obtain cpap download data.

## 2016-07-14 ENCOUNTER — Telehealth: Payer: Self-pay | Admitting: Internal Medicine

## 2016-07-14 NOTE — Telephone Encounter (Signed)
Error-wrong dr

## 2016-07-14 NOTE — Telephone Encounter (Signed)
Error.Stanley A Dalton ° °

## 2016-07-15 NOTE — Telephone Encounter (Signed)
Please follow instructions as per Dr Ashby Dawes. She can call back tomorrow if any questions

## 2016-07-15 NOTE — Telephone Encounter (Signed)
Spoke with pt. States that she is having issues with headaches. She recently saw Dr. Ashby Dawes on 07/09/16. Pt was told that if her headaches did not resolve she would need to stop her CPAP. Her concern with stopping the CPAP is needing her oxygen removed from the CPAP. Advised pt that we could contact her DME and take care of that for her. She wants to make sure the Dr. Ashby Dawes really wants her to stop her CPAP.  Dr. Ashby Dawes - please advise. Thanks.

## 2016-07-15 NOTE — Telephone Encounter (Signed)
Pt called to let us know she will not be home until after  2:30, so please call after then.

## 2016-07-15 NOTE — Telephone Encounter (Signed)
Below were Rama's recc. To the pt. Pt. Will contact will contact her DME about separating her O2 from her CPAP machine. If she has any further questions she will follow up with Katie. Nothing further is needed at this time.    Instructions      Return in about 3 months (around 10/07/2016).  --Try stopping the nasal spray for one week.   --If not successful then also try stopping the cpap for 3-4 days to see if the headache goes away.   --Will need to obtain cpap download data.

## 2016-07-16 ENCOUNTER — Telehealth: Payer: Self-pay | Admitting: Internal Medicine

## 2016-07-16 NOTE — Telephone Encounter (Signed)
Will close this note, as there is another one currently open.

## 2016-07-16 NOTE — Telephone Encounter (Signed)
Pt is aware of Dr. Zoila Shutter response. She is still adamant about her oxygen needing to be separated from her CPAP. States that she can't go without her CPAP for 3-4 days because that means that she will have to go without her oxygen.  Dr. Alva Garnet - please advise if it would be okay for the pt to go without for oxygen for a few days just to see if the CPAP is reason for her headache. Thanks.

## 2016-07-16 NOTE — Telephone Encounter (Signed)
Attempted to contact pt. No answer, no option to leave a message. Will try back.  

## 2016-07-16 NOTE — Telephone Encounter (Signed)
That is OK to try for 2-3 nights

## 2016-07-17 NOTE — Telephone Encounter (Signed)
Spoke with pt. She is aware of Dr. Alva Garnet recommendations. Nothing further was needed.

## 2016-07-21 ENCOUNTER — Telehealth: Payer: Self-pay | Admitting: Internal Medicine

## 2016-07-21 DIAGNOSIS — G4733 Obstructive sleep apnea (adult) (pediatric): Secondary | ICD-10-CM

## 2016-07-21 NOTE — Telephone Encounter (Signed)
Pt tried off her CPAP for 4 nights. She has not had any headaches. Pt has oxygen that is bleed through her CPAP, this will need to be split if CPAP is being discontinued.  Dr. Alva Garnet - please advise if CPAP is to be discontinued. Thanks.

## 2016-07-21 NOTE — Telephone Encounter (Signed)
Patient called and has been off her cpap machine for 4 nights and hasn't had any headaches. Please call patient on what day and time they will be coming to adjust her machine as she has company.

## 2016-07-22 NOTE — Telephone Encounter (Signed)
Order has been placed for [facility] to set the cpap at auto 5-10 and I have attempted to call the pt, but no VM and no answer. Will try back later.

## 2016-07-22 NOTE — Telephone Encounter (Signed)
I don't know her well enough and Dr Ashby Dawes is on now. Please direct this question to him  Merton Border, MD PCCM service Mobile [phone] Pager [phone] 07/22/2016

## 2016-07-22 NOTE — Telephone Encounter (Signed)
Patient says she has not heard anything from [facility] and is unable to contact them as their number has changed.  She is concerned that she cannot get in touch with someone there about picking up her machine.  Patient wants a call back but has to take her daughter to the doctor .

## 2016-07-22 NOTE — Telephone Encounter (Signed)
Dr. Ashby Dawes please advise on this pt.  thanks

## 2016-07-22 NOTE — Telephone Encounter (Signed)
Try auto-PAP with range of 5-10. Call if the headaches come back, if not then continue with PAP.

## 2016-07-24 NOTE — Telephone Encounter (Signed)
Pt aware of DR recommendation & voiced her understanding. Nothing further needed.

## 2016-08-07 ENCOUNTER — Telehealth: Payer: Self-pay | Admitting: Internal Medicine

## 2016-08-07 NOTE — Telephone Encounter (Signed)
Pt states she had received a message from [place] stating she needs to call in regards to the changes DR made on her CPAP and the number she has is not correct. Gave pt number to call. Nothing further needed.

## 2016-08-07 NOTE — Telephone Encounter (Signed)
Pt wanted to let us know she heard from [place] and they have approved for her oxygen. Please call.

## 2016-08-18 ENCOUNTER — Telehealth: Payer: Self-pay | Admitting: Internal Medicine

## 2016-08-18 NOTE — Telephone Encounter (Signed)
Pt states she needs to know what the next step after her testing . Please call and advise.

## 2016-08-19 NOTE — Telephone Encounter (Signed)
Pt called and states you told her to stop using her CPAP x 4 nights due to headaches and then go back on it and then settings were changed on 08/15/16. Pt states after the change that the 1st 2 nights she didn't wake up with a headache but the last 2 nights she did. Please advise.

## 2016-08-21 NOTE — Telephone Encounter (Signed)
Patient son, Dr. Wylie Hail calling to check on status of advice for cpap.  Patient has called a few times and hasn't gotten a response.  Please call [phone]

## 2016-08-21 NOTE — Telephone Encounter (Signed)
LMOVM for pt letting her know again that DR will respond to the message.

## 2016-08-24 NOTE — Telephone Encounter (Signed)
If still having headaches with cpap, then she should stop using cpap.  Even if the CPAP is not directly causing the headaches, it could make any headaches she has worse.

## 2016-08-24 NOTE — Telephone Encounter (Signed)
Pt aware of DR's recommendations and voiced her understanding.  Pt is concerned about not having her O2, as it is bled in to cpap. I have advised pt that we will contact [facility] to bring if DR agrees to d/c cpap.  DR please advise if you would like to d/c cpap and order a home filled system. Thanks.

## 2016-08-25 ENCOUNTER — Other Ambulatory Visit: Payer: Self-pay

## 2016-08-25 DIAGNOSIS — I272 Pulmonary hypertension, unspecified: Secondary | ICD-10-CM

## 2016-08-25 DIAGNOSIS — G4733 Obstructive sleep apnea (adult) (pediatric): Secondary | ICD-10-CM

## 2016-08-25 NOTE — Telephone Encounter (Signed)
Check an ONO on 2L which is what is bled in to her CPAP. Lets not DC her PAP just yet. If her headaches go away, she might be able to start it again.

## 2016-08-25 NOTE — Telephone Encounter (Signed)
Per DR verbally-order ono on 2L O2, in regards to 08-07-16 phone note that has been closed.  Order has been placed. Pt aware & voiced her understanding. Nothing further needed

## 2016-08-26 NOTE — Telephone Encounter (Signed)
Pt aware of below recommendations and voiced her understanding. ONO has been placed. Nothing further needed.

## 2016-08-31 ENCOUNTER — Encounter: Payer: Self-pay | Admitting: Internal Medicine

## 2016-08-31 DIAGNOSIS — I272 Pulmonary hypertension, unspecified: Secondary | ICD-10-CM

## 2016-09-07 ENCOUNTER — Telehealth: Payer: Self-pay | Admitting: Internal Medicine

## 2016-09-07 NOTE — Telephone Encounter (Signed)
LMAOM for pt's daughter, Margarita Grizzle, that ONO report from [facility] has been obtained and is in Dr. Mathis Fare folder to address first thing in the morning when he is back in the office.  That we will contact her back tomorrow when physician advises what is needed. Advised daughter on voice mail to contact me back if she had any questions. Rhonda J Cobb

## 2016-09-07 NOTE — Telephone Encounter (Signed)
Pt daughter states pt has been with out oxygen for over 1 month. She would like to know if Dr. Juanell Fairly received her oxygen test that was performed over a week ago. If so, she would like to know if an order will be placed to [facility] for a new smaller oxygen machine. Please call and advise.

## 2016-09-07 NOTE — Telephone Encounter (Signed)
ONO was done by [facility] on 08/31/16. Called [facility] to get ONO report faxed to [place].  ONO report is in your folder. Daughter is concerned b/c her mother is unable to use o2 until we address this report. Advised daughter that I would contact [employer] and obtain this report. Daughter states that the concentrator is still connected to her CPAP machine which she is not using at this time due to having headaches.  Daughter is stating that her mother has the flu and is very concerned about going without o2.  Please advise. Rhonda J Cobb

## 2016-09-07 NOTE — Telephone Encounter (Signed)
Please advise on message below Thanks

## 2016-09-08 NOTE — Telephone Encounter (Signed)
Test performed on RA, which showed desats. Will need to repeat on 2L oxygen.

## 2016-09-09 NOTE — Telephone Encounter (Signed)
Debra Cantu spoke with pt and informed her that the ONO would have to be repeated due to she needs to do it on O2 and she wasn't given a nasal cannula per DME. Nothing further needed at this time.

## 2016-09-12 ENCOUNTER — Encounter: Payer: Self-pay | Admitting: Emergency Medicine

## 2016-09-12 ENCOUNTER — Emergency Department
Admission: EM | Admit: 2016-09-12 | Discharge: 2016-09-12 | Disposition: A | Discharge status: Home / Self Care | Payer: Medicare Other | Attending: Emergency Medicine | Admitting: Emergency Medicine

## 2016-09-12 ENCOUNTER — Emergency Department: Payer: Medicare Other

## 2016-09-12 DIAGNOSIS — Z7901 Long term (current) use of anticoagulants: Secondary | ICD-10-CM | POA: Diagnosis not present

## 2016-09-12 DIAGNOSIS — I10 Essential (primary) hypertension: Secondary | ICD-10-CM | POA: Diagnosis not present

## 2016-09-12 DIAGNOSIS — R079 Chest pain, unspecified: Secondary | ICD-10-CM | POA: Insufficient documentation

## 2016-09-12 DIAGNOSIS — Z79899 Other long term (current) drug therapy: Secondary | ICD-10-CM | POA: Diagnosis not present

## 2016-09-12 DIAGNOSIS — R0602 Shortness of breath: Secondary | ICD-10-CM | POA: Diagnosis not present

## 2016-09-12 DIAGNOSIS — Z87891 Personal history of nicotine dependence: Secondary | ICD-10-CM | POA: Insufficient documentation

## 2016-09-12 LAB — BASIC METABOLIC PANEL
ANION GAP: 7 (ref 5–15)
BUN: 19 mg/dL (ref 6–20)
CHLORIDE: 104 mmol/L (ref 101–111)
CO2: 26 mmol/L (ref 22–32)
Calcium: 9.7 mg/dL (ref 8.9–10.3)
Creatinine, Ser: 0.72 mg/dL (ref 0.44–1.00)
GFR calc Af Amer: >60 mL/min (ref >60)
GLUCOSE: 104 mg/dL — AB (ref 65–99)
POTASSIUM: 4.5 mmol/L (ref 3.5–5.1)
Sodium: 137 mmol/L (ref 135–145)

## 2016-09-12 LAB — CBC
HCT: 43.8 % (ref 35.0–47.0)
HEMOGLOBIN: 15 g/dL (ref 12.0–16.0)
MCH: 30.1 pg (ref 26.0–34.0)
MCHC: 34.3 g/dL (ref 32.0–36.0)
MCV: 87.8 fL (ref 80.0–100.0)
Platelets: 196 10*3/uL (ref 150–440)
RBC: 4.98 MIL/uL (ref 3.80–5.20)
RDW: 14.3 % (ref 11.5–14.5)
WBC: 7 10*3/uL (ref 3.6–11.0)

## 2016-09-12 LAB — PROTIME-INR
INR: 2.36
Prothrombin Time: 26.2 seconds — ABNORMAL HIGH (ref 11.4–15.2)

## 2016-09-12 LAB — TROPONIN I: Troponin I: <0.03 ng/mL (ref <0.03)

## 2016-09-12 MED ORDER — SUCRALFATE 1 G PO TABS: 1.0000 g | ORAL_TABLET | Freq: Four times a day (QID) | ORAL | 0 refills | Status: DC

## 2016-09-12 MED ORDER — FAMOTIDINE 40 MG PO TABS: 40.0000 mg | ORAL_TABLET | Freq: Every day | ORAL | 0 refills | Status: DC

## 2016-09-12 MED ORDER — GI COCKTAIL ~~LOC~~
30.0000 mL | Freq: Once | ORAL | Status: AC
  Administered 2016-09-12: 30 mL via ORAL
  Filled 2016-09-12: qty 30

## 2016-09-12 NOTE — ED Triage Notes (Signed)
Pt is from [facility] and has been c/o chest pain x1 week.  Pt has hx of bicuspid valve malfunction and is on blood thinners and cannot have surgery. Pt sees Dr. Ubaldo Glassing last saw him about 1 month ago.  Pt states the chest pain is in the center of her chest and at times feels short of breath and lightheaded.  Pt wears oxygen at bedtime, but has been without for several weeks.

## 2016-09-12 NOTE — ED Notes (Signed)
ED Provider at bedside. 

## 2016-09-12 NOTE — ED Notes (Signed)
Pt alert and oriented X4, active, cooperative, pt in NAD. RR even and unlabored, color WNL.  Pt informed to return if any life threatening symptoms occur.   

## 2016-09-12 NOTE — Discharge Instructions (Signed)
Please seek medical attention for any high fevers, chest pain, shortness of breath, change in behavior, persistent vomiting, bloody stool or any other new or concerning symptoms.  

## 2016-09-12 NOTE — ED Provider Notes (Signed)
[facility] Emergency Department Provider Note  ____________________________________________   I have reviewed the triage vital signs and the nursing notes.   HISTORY  Chief Complaint Chest Pain   History limited by: Dementia   HPI Debra Cantu is a 83 y.o. female who presents to the emergency department today because of concerns for chest pain. It is been going on for roughly 1 week. Located in the central and left lower chest. The patient states that she has had some shortness of breath with this. It is intermittently both sharp and pressure-like. The patient just told her friend about it today and that is why she is seen in the emergency department today. Patient states that recently she has been suffering from a viral stomach bug. She denies any recent fevers.   Past Medical History:  Diagnosis Date  . Arthritis   . Enlarged heart   . Hypercholesteremia   . Hypertension   . Macular degeneration   . Osteoporosis   . Pulmonary embolism [facility])     Patient Active Problem List   Diagnosis Date Noted  . Costochondritis 07/03/2015  . Chronic diastolic heart failure ([place]) 03/18/2015  . URI (upper respiratory infection) 12/07/2014  . Pulmonary hypertension 10/17/2014  . Acute pulmonary embolism ([place]) 10/17/2014  . Obstructive sleep apnea 10/17/2014    Past Surgical History:  Procedure Laterality Date  . CATARACT EXTRACTION  2013  . CHOLECYSTECTOMY  2003  . HERNIA REPAIR  2004  . reconstructive foot surgery  2002  . REPLACEMENT TOTAL KNEE Right   . ruptured disc repair     T4, T5  . VAGINAL HYSTERECTOMY  1978  . VEIN LIGATION Bilateral     Prior to Admission medications   Medication Sig Start Date End Date Taking? Authorizing Provider  acetaminophen (TYLENOL) 325 MG tablet Take 650 mg by mouth every 6 (six) hours as needed.    Historical Provider, MD  amLODipine (NORVASC) 5 MG tablet Take 2.5 mg by mouth daily.    Historical Provider, MD   atorvastatin (LIPITOR) 80 MG tablet Take 80 mg by mouth daily.    Historical Provider, MD  beta carotene 15 MG capsule Take 15 mg by mouth daily.    Historical Provider, MD  busPIRone (BUSPAR) 15 MG tablet Take by mouth. 03/18/16 03/18/17  Historical Provider, MD  carboxymethylcellulose (REFRESH PLUS) 0.5 % SOLN 1 drop 2 (two) times daily as needed.    Historical Provider, MD  cholecalciferol (VITAMIN D) 400 UNITS TABS tablet Take 400 Units by mouth daily.    Historical Provider, MD  cromolyn (NASALCROM) 5.2 MG/ACT nasal spray 1 spray by Nasal route 4 (four) times daily. 05/31/15 05/30/16  Historical Provider, MD  furosemide (LASIX) 20 MG tablet Take 1 tablet (20 mg total) by mouth daily. 03/18/15 03/17/16  Flora Lipps, MD  glucosamine-chondroitin 500-400 MG tablet Take 1 tablet by mouth daily.    Historical Provider, MD  levothyroxine (SYNTHROID, LEVOTHROID) 88 MCG tablet Take 88 mcg by mouth daily before breakfast.    Historical Provider, MD  LORazepam (ATIVAN) 0.5 MG tablet Take 1 tablet (0.5 mg total) by mouth every 8 (eight) hours as needed for anxiety. 03/28/16 03/28/17  [name], MD  Melatonin 3 MG TABS Take 1 tablet by mouth at bedtime.    Historical Provider, MD  Multiple Vitamins-Minerals (PRESERVISION AREDS 2 PO) Take 1 capsule by mouth daily.    Historical Provider, MD  polyethylene glycol (MIRALAX / GLYCOLAX) packet Take 17 g by mouth  daily.    Historical Provider, MD  valsartan (DIOVAN) 320 MG tablet Take 320 mg by mouth daily.    Historical Provider, MD  venlafaxine (EFFEXOR) 37.5 MG tablet Take by mouth. 06/30/16   Historical Provider, MD  warfarin (COUMADIN) 4 MG tablet Take 3.5 mg by mouth daily.     Historical Provider, MD    Allergies Oxycodone  Family History  Problem Relation Age of Onset  . Heart disease Brother   . Stroke Father   . Alzheimer's disease Mother   . Cancer Sister     skin    Social History Social History  Substance Use Topics  . Smoking status:  Former Smoker    Packs/day: 0.10    Years: 1.00    Types: Cigarettes    Quit date: 10/17/1954  . Smokeless tobacco: Never Used  . Alcohol use No    Review of Systems  Constitutional: Negative for fever. Cardiovascular: Positive for chest pain. Respiratory: Positive for shortness of breath. Gastrointestinal: Negative for abdominal pain, vomiting and diarrhea. Neurological: Negative for headaches, focal weakness or numbness.  10-point ROS otherwise negative.  ____________________________________________   PHYSICAL EXAM:  VITAL SIGNS: ED Triage Vitals  Enc Vitals Group     BP 09/12/16 1521 (!) 167/85     Pulse --      Resp 09/12/16 1521 20     Temp 09/12/16 1521 97.5 F (36.4 C)     Temp Source 09/12/16 1521 Oral     SpO2 09/12/16 1521 95 %     Weight 09/12/16 1518 181 lb (82.1 kg)     Height 09/12/16 1518 4\' 11"  (1.499 m)     Head Circumference --      Peak Flow --      Pain Score 09/12/16 1519 4   Constitutional: Awake and alert. Well appearing and in no distress. Eyes: Conjunctivae are normal. Normal extraocular movements. ENT   Head: Normocephalic and atraumatic.   Nose: No congestion/rhinnorhea.   Mouth/Throat: Mucous membranes are moist.   Neck: No stridor. Hematological/Lymphatic/Immunilogical: No cervical lymphadenopathy. Cardiovascular: Normal rate, regular rhythm.  No murmurs, rubs, or gallops.  Respiratory: Normal respiratory effort without tachypnea nor retractions. Breath sounds are clear and equal bilaterally. No wheezes/rales/rhonchi. Gastrointestinal: Soft and non tender. No rebound. No guarding.  Genitourinary: Deferred Musculoskeletal: Normal range of motion in all extremities. No lower extremity edema. Neurologic:  Normal speech and language. No gross focal neurologic deficits are appreciated.  Skin:  Skin is warm, dry and intact. No rash noted. Psychiatric: Mood and affect are normal. Speech and behavior are normal. Patient exhibits  appropriate insight and judgment.  ____________________________________________    LABS (pertinent positives/negatives)  Labs Reviewed  BASIC METABOLIC PANEL - Abnormal; Notable for the following:       Result Value   Glucose, Bld 104 (*)    All other components within normal limits  PROTIME-INR - Abnormal; Notable for the following:    Prothrombin Time 26.2 (*)    All other components within normal limits  CBC  TROPONIN I     ____________________________________________   EKG  I, Nance Pear, attending physician, personally viewed and interpreted this EKG  EKG Time: 1518 Rate: 86 Rhythm: normal sinus rhythm Axis: left axis deviation Intervals: qtc 428 QRS: narrow ST changes: no st elevation Impression: abnormal ekg   ____________________________________________    RADIOLOGY  cxr  IMPRESSION: Slight left base atelectasis. No edema or consolidation. Mildly tortuous aorta with aortic atherosclerosis. Cardiac silhouette unchanged.  ____________________________________________   PROCEDURES  Procedures  ____________________________________________   INITIAL IMPRESSION / ASSESSMENT AND PLAN / ED COURSE  Pertinent labs & imaging results that were available during my care of the patient were reviewed by me and considered in my medical decision making (see chart for details).  Patient presented to the emergency department today because of concerns for chest pain 1 week. Blood work without any concerning findings of elevated troponin. EKG without any concerning findings. No pneumonias. Patient was given a GI cocktail and stated it did significantly help the pain. At this point I think esophagitis or gastritis likely. Plan on discharging home with an acid as well as cycle today.  ____________________________________________   FINAL CLINICAL IMPRESSION(S) / ED DIAGNOSES  Final diagnoses:  Nonspecific chest pain     Note: This dictation was  prepared with Dragon dictation. Any transcriptional errors that result from this process are unintentional     Nance Pear, MD 09/12/16 [id number]

## 2016-10-09 ENCOUNTER — Telehealth: Payer: Self-pay | Admitting: Internal Medicine

## 2016-10-09 NOTE — Telephone Encounter (Signed)
Noted  

## 2016-10-09 NOTE — Telephone Encounter (Signed)
Pt daughter calling stating pt has not been using her CPAP machine for a while now Her doctors took her off it for various reasons They are calling to just let us know they cancelled [employer] to come out and do the reading on her machine  Just wanted to let us know

## 2016-10-12 ENCOUNTER — Ambulatory Visit (INDEPENDENT_AMBULATORY_CARE_PROVIDER_SITE_OTHER): Payer: Medicare Other | Admitting: Internal Medicine

## 2016-10-12 ENCOUNTER — Encounter: Payer: Self-pay | Admitting: Internal Medicine

## 2016-10-12 VITALS — BP 144/80 | HR 92 | Ht 59.0 in | Wt 183.0 lb

## 2016-10-12 DIAGNOSIS — R0602 Shortness of breath: Secondary | ICD-10-CM | POA: Diagnosis not present

## 2016-10-12 DIAGNOSIS — I272 Pulmonary hypertension, unspecified: Secondary | ICD-10-CM | POA: Diagnosis not present

## 2016-10-12 NOTE — Progress Notes (Signed)
Date: 10/12/2016,   MRN# [id number] Debra Cantu 05-Aug-1927 Code Status:  Hosp day:@LENGTHOFSTAYDAYS @ Referring MD: @ATDPROV @     PCP:      AdmissionWeight: 183 lb (83 kg)                 CurrentWeight: 183 lb (83 kg) Debra Cantu is a 83 y.o. old female seen in consultation for SOb and Sleep apnea   Synopsis: patient with presumed PULM HTN with chronic hypoxic resp failure with OSA L Frontal Meningioma   CHIEF COMPLAINT:   Follow up OSA   HISTORY OF PRESENT ILLNESS   Patient states that she is feeling good Stopped her CPAP due to headaches no acute SOB, no cough, no wheezing. Patient sitting comfortably, no resp distress No signs of infection at this time Lower ext swelling has improved Doing well overall She has fatigue and tired all the time She wears 2 L [place] at night      Current Medication:  Current Outpatient Prescriptions:  .  acetaminophen (TYLENOL) 325 MG tablet, Take 650 mg by mouth every 6 (six) hours as needed., Disp: , Rfl:  .  amLODipine (NORVASC) 5 MG tablet, Take 2.5 mg by mouth daily., Disp: , Rfl:  .  atorvastatin (LIPITOR) 80 MG tablet, Take 80 mg by mouth daily., Disp: , Rfl:  .  beta carotene 15 MG capsule, Take 15 mg by mouth daily., Disp: , Rfl:  .  carboxymethylcellulose (REFRESH PLUS) 0.5 % SOLN, 1 drop 2 (two) times daily as needed., Disp: , Rfl:  .  cholecalciferol (VITAMIN D) 400 UNITS TABS tablet, Take 400 Units by mouth daily., Disp: , Rfl:  .  glucosamine-chondroitin 500-400 MG tablet, Take 1 tablet by mouth daily., Disp: , Rfl:  .  levothyroxine (SYNTHROID, LEVOTHROID) 88 MCG tablet, Take 88 mcg by mouth daily before breakfast., Disp: , Rfl:  .  LORazepam (ATIVAN) 0.5 MG tablet, Take 1 tablet (0.5 mg total) by mouth every 8 (eight) hours as needed for anxiety., Disp: 30 tablet, Rfl: 0 .  polyethylene glycol (MIRALAX / GLYCOLAX) packet, Take 17 g by mouth daily., Disp: , Rfl:  .  valsartan (DIOVAN) 320 MG tablet, Take 320 mg by mouth  daily., Disp: , Rfl:  .  venlafaxine (EFFEXOR) 37.5 MG tablet, Take by mouth., Disp: , Rfl:  .  warfarin (COUMADIN) 4 MG tablet, Take 5 mg by mouth daily. , Disp: , Rfl:     ALLERGIES   Oxycodone     REVIEW OF SYSTEMS   Review of Systems  Constitutional: Negative for chills, fever and malaise/fatigue.  HENT: Negative for congestion.   Respiratory: Negative for cough, hemoptysis, sputum production, shortness of breath and wheezing.   Cardiovascular: Negative for chest pain, palpitations, orthopnea and leg swelling.  Gastrointestinal: Negative for abdominal pain, heartburn, nausea and vomiting.  Musculoskeletal: Positive for back pain.  Neurological: Negative for weakness.  Endo/Heme/Allergies: Negative.   Psychiatric/Behavioral: Negative.   All other systems reviewed and are negative.   BP (!) 144/80 (BP Location: Left Arm, Cuff Size: Normal)   Pulse 92   Ht 4\' 11"  (1.499 m)   Wt 183 lb (83 kg)   SpO2 96%   BMI 36.96 kg/m      PHYSICAL EXAM  Physical Exam  Constitutional: She is oriented to person, place, and time. No distress.  Cardiovascular: Normal rate, regular rhythm and normal heart sounds.   No murmur heard. Pulmonary/Chest: Effort normal and breath sounds normal. No respiratory distress. She has no  wheezes.  Musculoskeletal: Normal range of motion. She exhibits edema.  Neurological: She is alert and oriented to person, place, and time. No cranial nerve deficit.  Skin: Skin is warm. She is not diaphoretic.  Psychiatric: She has a normal mood and affect.        ASSESSMENT/PLAN    83 yo white female with presumed Pulmonary HTN with h/o PE along with signs and symptoms of Diastolic heart failure with chronic hypoxic resp failure with OSA and L Frontal Meningioma  OSA with Chronic hypoxic resp failure -CPAP stopped due to HA -continue oxygen at night 2 L  Pulmonary hypertension with fatigue She has evidence of pulmonary hypertension on an echocardiogram  but she has not undergone a right heart catheterization- -I would not recommend cath at this point -continue oxygen therapy as night -will check 6MWT  Diastolic Heart failure-euvolumic state at this time -she is now taking lasix as needed -recommend follow up with cardiology as needed   Anxiety -Klonipin  0.5 mg.  Meningioma-not a surgical case at this time -follow up neurology as scheduled  Follow up 3 months    Patient is satisfied with Plan of action and management.    [name], M.D.  [facility]  Medical Director [place] Director [facility] Cardio-Pulmonary Department

## 2016-10-12 NOTE — Patient Instructions (Signed)
Check 6MWT 

## 2016-10-13 NOTE — Addendum Note (Signed)
Addended by: Maryanna Shape A on: 10/13/2016 09:48 AM   Modules accepted: Orders

## 2016-10-21 ENCOUNTER — Ambulatory Visit: Payer: Medicare Other

## 2016-10-21 ENCOUNTER — Ambulatory Visit (INDEPENDENT_AMBULATORY_CARE_PROVIDER_SITE_OTHER): Payer: Medicare Other | Admitting: *Deleted

## 2016-10-21 DIAGNOSIS — R0602 Shortness of breath: Secondary | ICD-10-CM

## 2016-10-21 NOTE — Progress Notes (Signed)
SMW performed today. 

## 2016-11-04 ENCOUNTER — Ambulatory Visit: Payer: Medicare Other

## 2017-01-12 ENCOUNTER — Ambulatory Visit (INDEPENDENT_AMBULATORY_CARE_PROVIDER_SITE_OTHER): Payer: Medicare Other | Admitting: Internal Medicine

## 2017-01-12 ENCOUNTER — Encounter: Payer: Self-pay | Admitting: Internal Medicine

## 2017-01-12 VITALS — BP 114/70 | HR 81 | Ht 59.0 in | Wt 173.0 lb

## 2017-01-12 DIAGNOSIS — J9611 Chronic respiratory failure with hypoxia: Secondary | ICD-10-CM

## 2017-01-12 NOTE — Progress Notes (Signed)
Date: 01/12/2017,   MRN# [id number] Debra Cantu June 20, 1928 Code Status:  Hosp day:@LENGTHOFSTAYDAYS @ Referring MD: @ATDPROV @     PCP:      AdmissionWeight: 173 lb (78.5 kg)                 CurrentWeight: 173 lb (78.5 kg) Debra Cantu is a 83 y.o. old female seen in consultation for SOb and Sleep apnea   Synopsis: patient with presumed PULM HTN with chronic hypoxic resp failure with OSA L Frontal Meningioma   CHIEF COMPLAINT:   Follow up OSA, Follow-up respiratory failure   HISTORY OF PRESENT ILLNESS   Patient states that she is feeling good Stopped her CPAP due to headaches no acute SOB, no cough, no wheezing. Patient sitting comfortably, no resp distress No signs of infection at this time Lower ext swelling has improved Doing well overall She has fatigue and tired all the time She wears 2 L [place] at night  Patient has lost 20 pounds since last visit and is feeling really good      Current Medication:  Current Outpatient Prescriptions:  .  acetaminophen (TYLENOL) 325 MG tablet, Take 650 mg by mouth every 6 (six) hours as needed., Disp: , Rfl:  .  amLODipine (NORVASC) 5 MG tablet, Take 2.5 mg by mouth daily., Disp: , Rfl:  .  atorvastatin (LIPITOR) 80 MG tablet, Take 80 mg by mouth daily., Disp: , Rfl:  .  beta carotene 15 MG capsule, Take 15 mg by mouth daily., Disp: , Rfl:  .  carboxymethylcellulose (REFRESH PLUS) 0.5 % SOLN, 1 drop 2 (two) times daily as needed., Disp: , Rfl:  .  cholecalciferol (VITAMIN D) 400 UNITS TABS tablet, Take 400 Units by mouth daily., Disp: , Rfl:  .  glucosamine-chondroitin 500-400 MG tablet, Take 1 tablet by mouth daily., Disp: , Rfl:  .  levothyroxine (SYNTHROID, LEVOTHROID) 88 MCG tablet, Take 88 mcg by mouth daily before breakfast., Disp: , Rfl:  .  LORazepam (ATIVAN) 0.5 MG tablet, Take 1 tablet (0.5 mg total) by mouth every 8 (eight) hours as needed for anxiety., Disp: 30 tablet, Rfl: 0 .  polyethylene glycol (MIRALAX / GLYCOLAX)  packet, Take 17 g by mouth daily., Disp: , Rfl:  .  valsartan (DIOVAN) 320 MG tablet, Take 320 mg by mouth daily., Disp: , Rfl:  .  venlafaxine (EFFEXOR) 37.5 MG tablet, Take by mouth., Disp: , Rfl:  .  warfarin (COUMADIN) 4 MG tablet, Take 5 mg by mouth daily. , Disp: , Rfl:     ALLERGIES   Oxycodone     REVIEW OF SYSTEMS   Review of Systems  Constitutional: Negative for chills, fever and malaise/fatigue.  HENT: Negative for congestion.   Respiratory: Negative for cough, hemoptysis, sputum production, shortness of breath and wheezing.   Cardiovascular: Negative for chest pain, palpitations, orthopnea and leg swelling.  Gastrointestinal: Negative for heartburn.  Musculoskeletal: Positive for back pain.  Neurological: Negative for weakness.  Endo/Heme/Allergies: Negative.   Psychiatric/Behavioral: Negative.   All other systems reviewed and are negative.   BP 114/70 (BP Location: Left Arm, Cuff Size: Normal)   Pulse 81   Ht 4\' 11"  (1.499 m)   Wt 173 lb (78.5 kg)   SpO2 96%   BMI 34.94 kg/m     PHYSICAL EXAM  Physical Exam  Constitutional: She is oriented to person, place, and time. No distress.  Cardiovascular: Normal rate, regular rhythm and normal heart sounds.   No murmur heard. Pulmonary/Chest: Effort normal  and breath sounds normal. No respiratory distress. She has no wheezes.  Musculoskeletal: Normal range of motion. She exhibits edema.  Neurological: She is alert and oriented to person, place, and time. No cranial nerve deficit.  Skin: Skin is warm. She is not diaphoretic.  Psychiatric: She has a normal mood and affect.        ASSESSMENT/PLAN  83 yo white female with presumed Pulmonary HTN with h/o PE along with signs and symptoms of Diastolic heart failure with chronic hypoxic resp failure with OSA and L Frontal Meningioma  OSA with Chronic hypoxic resp failure -CPAP stopped due to HA -continue oxygen at night 2 L  Pulmonary hypertension with  fatigue She has evidence of pulmonary hypertension on an echocardiogram but she has not undergone a right heart catheterization- -I would not recommend cath at this point -continue oxygen therapy as night - 6MWT within normal limits  Diastolic Heart failure-euvolumic state at this time -she is now taking lasix as needed -recommend follow up with cardiology as needed   Anxiety -Klonipin  0.5 mg.  Meningioma-not a surgical case at this time -follow up neurology as scheduled  Obesity -recommend significant weight loss -recommend changing diet  Deconditioned state -Recommend increased daily activity and exercise    Follow up 6 months    Patient is satisfied with Plan of action and management.    [name], M.D.  [facility]  Medical Director [place] Director [facility] Cardio-Pulmonary Department

## 2017-01-12 NOTE — Patient Instructions (Signed)
Continue oxygen as prescribed 

## 2017-05-25 ENCOUNTER — Encounter: Payer: Self-pay | Admitting: Intensive Care

## 2017-05-25 ENCOUNTER — Inpatient Hospital Stay
Admission: EM | Admit: 2017-05-25 | Discharge: 2017-05-28 | DRG: 082 | Disposition: A | Discharge status: Skilled Nursing Facility | Payer: Medicare Other | Attending: Internal Medicine | Admitting: Internal Medicine

## 2017-05-25 ENCOUNTER — Emergency Department: Payer: Medicare Other

## 2017-05-25 DIAGNOSIS — Z515 Encounter for palliative care: Secondary | ICD-10-CM | POA: Diagnosis present

## 2017-05-25 DIAGNOSIS — S065XAA Traumatic subdural hemorrhage with loss of consciousness status unknown, initial encounter: Secondary | ICD-10-CM | POA: Diagnosis present

## 2017-05-25 DIAGNOSIS — R51 Headache: Secondary | ICD-10-CM | POA: Diagnosis not present

## 2017-05-25 DIAGNOSIS — S065X9A Traumatic subdural hemorrhage with loss of consciousness of unspecified duration, initial encounter: Principal | ICD-10-CM | POA: Diagnosis present

## 2017-05-25 DIAGNOSIS — M533 Sacrococcygeal disorders, not elsewhere classified: Secondary | ICD-10-CM | POA: Diagnosis present

## 2017-05-25 DIAGNOSIS — Z96651 Presence of right artificial knee joint: Secondary | ICD-10-CM | POA: Diagnosis present

## 2017-05-25 DIAGNOSIS — E039 Hypothyroidism, unspecified: Secondary | ICD-10-CM | POA: Diagnosis present

## 2017-05-25 DIAGNOSIS — I10 Essential (primary) hypertension: Secondary | ICD-10-CM | POA: Diagnosis present

## 2017-05-25 DIAGNOSIS — S0990XA Unspecified injury of head, initial encounter: Secondary | ICD-10-CM

## 2017-05-25 DIAGNOSIS — R258 Other abnormal involuntary movements: Secondary | ICD-10-CM | POA: Diagnosis present

## 2017-05-25 DIAGNOSIS — Z885 Allergy status to narcotic agent status: Secondary | ICD-10-CM | POA: Diagnosis not present

## 2017-05-25 DIAGNOSIS — Z888 Allergy status to other drugs, medicaments and biological substances status: Secondary | ICD-10-CM

## 2017-05-25 DIAGNOSIS — G40909 Epilepsy, unspecified, not intractable, without status epilepticus: Secondary | ICD-10-CM | POA: Diagnosis present

## 2017-05-25 DIAGNOSIS — G935 Compression of brain: Secondary | ICD-10-CM | POA: Diagnosis present

## 2017-05-25 DIAGNOSIS — R05 Cough: Secondary | ICD-10-CM | POA: Diagnosis not present

## 2017-05-25 DIAGNOSIS — M810 Age-related osteoporosis without current pathological fracture: Secondary | ICD-10-CM | POA: Diagnosis present

## 2017-05-25 DIAGNOSIS — E785 Hyperlipidemia, unspecified: Secondary | ICD-10-CM | POA: Diagnosis present

## 2017-05-25 DIAGNOSIS — W010XXA Fall on same level from slipping, tripping and stumbling without subsequent striking against object, initial encounter: Secondary | ICD-10-CM | POA: Diagnosis present

## 2017-05-25 DIAGNOSIS — Z86711 Personal history of pulmonary embolism: Secondary | ICD-10-CM

## 2017-05-25 DIAGNOSIS — Z7189 Other specified counseling: Secondary | ICD-10-CM | POA: Diagnosis not present

## 2017-05-25 DIAGNOSIS — M1990 Unspecified osteoarthritis, unspecified site: Secondary | ICD-10-CM | POA: Diagnosis present

## 2017-05-25 DIAGNOSIS — D689 Coagulation defect, unspecified: Secondary | ICD-10-CM | POA: Diagnosis present

## 2017-05-25 DIAGNOSIS — Z7901 Long term (current) use of anticoagulants: Secondary | ICD-10-CM | POA: Diagnosis not present

## 2017-05-25 DIAGNOSIS — Z882 Allergy status to sulfonamides status: Secondary | ICD-10-CM | POA: Diagnosis not present

## 2017-05-25 DIAGNOSIS — I609 Nontraumatic subarachnoid hemorrhage, unspecified: Secondary | ICD-10-CM | POA: Diagnosis present

## 2017-05-25 DIAGNOSIS — H3530 Unspecified macular degeneration: Secondary | ICD-10-CM | POA: Diagnosis present

## 2017-05-25 DIAGNOSIS — Z79899 Other long term (current) drug therapy: Secondary | ICD-10-CM

## 2017-05-25 DIAGNOSIS — S0083XA Contusion of other part of head, initial encounter: Secondary | ICD-10-CM | POA: Diagnosis present

## 2017-05-25 DIAGNOSIS — Y92129 Unspecified place in nursing home as the place of occurrence of the external cause: Secondary | ICD-10-CM

## 2017-05-25 DIAGNOSIS — S066X9A Traumatic subarachnoid hemorrhage with loss of consciousness of unspecified duration, initial encounter: Secondary | ICD-10-CM | POA: Diagnosis present

## 2017-05-25 DIAGNOSIS — Z66 Do not resuscitate: Secondary | ICD-10-CM | POA: Diagnosis present

## 2017-05-25 DIAGNOSIS — Z87891 Personal history of nicotine dependence: Secondary | ICD-10-CM

## 2017-05-25 DIAGNOSIS — R569 Unspecified convulsions: Secondary | ICD-10-CM | POA: Diagnosis not present

## 2017-05-25 DIAGNOSIS — R519 Headache, unspecified: Secondary | ICD-10-CM

## 2017-05-25 DIAGNOSIS — W19XXXA Unspecified fall, initial encounter: Secondary | ICD-10-CM

## 2017-05-25 LAB — CBC WITH DIFFERENTIAL/PLATELET
BASOS ABS: 0 10*3/uL (ref 0–0.1)
Basophils Relative: 0 %
EOS PCT: 1 %
Eosinophils Absolute: 0.1 10*3/uL (ref 0–0.7)
HEMATOCRIT: 44.2 % (ref 35.0–47.0)
HEMOGLOBIN: 14.4 g/dL (ref 12.0–16.0)
LYMPHS ABS: 1.1 10*3/uL (ref 1.0–3.6)
LYMPHS PCT: 15 %
MCH: 29.7 pg (ref 26.0–34.0)
MCHC: 32.7 g/dL (ref 32.0–36.0)
MCV: 91 fL (ref 80.0–100.0)
Monocytes Absolute: 0.7 10*3/uL (ref 0.2–0.9)
Monocytes Relative: 10 %
NEUTROS ABS: 5.6 10*3/uL (ref 1.4–6.5)
NEUTROS PCT: 74 %
PLATELETS: 183 10*3/uL (ref 150–440)
RBC: 4.85 MIL/uL (ref 3.80–5.20)
RDW: 14 % (ref 11.5–14.5)
WBC: 7.6 10*3/uL (ref 3.6–11.0)

## 2017-05-25 LAB — ABO/RH: ABO/RH(D): AB POS

## 2017-05-25 LAB — COMPREHENSIVE METABOLIC PANEL
ALK PHOS: 84 U/L (ref 38–126)
ALT: 26 U/L (ref 14–54)
AST: 27 U/L (ref 15–41)
Albumin: 3.8 g/dL (ref 3.5–5.0)
Anion gap: 5 (ref 5–15)
BUN: 13 mg/dL (ref 6–20)
CALCIUM: 9.9 mg/dL (ref 8.9–10.3)
CHLORIDE: 104 mmol/L (ref 101–111)
CO2: 29 mmol/L (ref 22–32)
CREATININE: 0.68 mg/dL (ref 0.44–1.00)
Glucose, Bld: 114 mg/dL — ABNORMAL HIGH (ref 65–99)
Potassium: 4.5 mmol/L (ref 3.5–5.1)
Sodium: 138 mmol/L (ref 135–145)
Total Bilirubin: 0.5 mg/dL (ref 0.3–1.2)
Total Protein: 6.3 g/dL — ABNORMAL LOW (ref 6.5–8.1)

## 2017-05-25 LAB — PROTIME-INR
INR: 1.94
Prothrombin Time: 22 seconds — ABNORMAL HIGH (ref 11.4–15.2)

## 2017-05-25 MED ORDER — ONDANSETRON HCL 4 MG/2ML IJ SOLN
INTRAMUSCULAR | Status: AC
  Filled 2017-05-25: qty 2

## 2017-05-25 MED ORDER — LEVETIRACETAM 500 MG/5ML IV SOLN
500.0000 mg | Freq: Two times a day (BID) | INTRAVENOUS | Status: DC
  Administered 2017-05-25 – 2017-05-27 (×4): 500 mg via INTRAVENOUS
  Filled 2017-05-25 (×5): qty 5

## 2017-05-25 MED ORDER — MORPHINE SULFATE (PF) 2 MG/ML IV SOLN
2.0000 mg | Freq: Once | INTRAVENOUS | Status: AC
  Administered 2017-05-25: 2 mg via INTRAVENOUS
  Filled 2017-05-25: qty 1

## 2017-05-25 MED ORDER — ONDANSETRON HCL 4 MG/2ML IJ SOLN
4.0000 mg | Freq: Once | INTRAMUSCULAR | Status: AC
  Administered 2017-05-25: 4 mg via INTRAVENOUS
  Filled 2017-05-25: qty 2

## 2017-05-25 MED ORDER — ONDANSETRON HCL 4 MG/2ML IJ SOLN
4.0000 mg | Freq: Four times a day (QID) | INTRAMUSCULAR | Status: DC | PRN
  Administered 2017-05-25 – 2017-05-26 (×3): 4 mg via INTRAVENOUS
  Filled 2017-05-25 (×3): qty 2

## 2017-05-25 MED ORDER — MORPHINE SULFATE (PF) 2 MG/ML IV SOLN
2.0000 mg | INTRAVENOUS | Status: DC | PRN
  Administered 2017-05-25 – 2017-05-28 (×9): 2 mg via INTRAVENOUS
  Filled 2017-05-25 (×9): qty 1

## 2017-05-25 MED ORDER — MORPHINE SULFATE (PF) 4 MG/ML IV SOLN
4.0000 mg | Freq: Once | INTRAVENOUS | Status: DC
  Filled 2017-05-25: qty 1

## 2017-05-25 MED ORDER — SCOPOLAMINE 1 MG/3DAYS TD PT72
1.0000 | MEDICATED_PATCH | TRANSDERMAL | Status: DC
  Administered 2017-05-25: 20:00:00 1.5 mg via TRANSDERMAL
  Filled 2017-05-25 (×2): qty 1

## 2017-05-25 MED ORDER — METOCLOPRAMIDE HCL 5 MG/ML IJ SOLN: 10.0000 mg | Freq: Once | INTRAMUSCULAR | Status: DC

## 2017-05-25 MED ORDER — ACETAMINOPHEN 650 MG RE SUPP: 650.0000 mg | Freq: Four times a day (QID) | RECTAL | Status: DC | PRN

## 2017-05-25 MED ORDER — ONDANSETRON HCL 4 MG/2ML IJ SOLN
4.0000 mg | Freq: Once | INTRAMUSCULAR | Status: AC
  Administered 2017-05-25: 4 mg via INTRAVENOUS

## 2017-05-25 MED ORDER — ACETAMINOPHEN 500 MG PO TABS
1000.0000 mg | ORAL_TABLET | Freq: Once | ORAL | Status: AC
  Administered 2017-05-25: 1000 mg via ORAL
  Filled 2017-05-25: qty 2

## 2017-05-25 MED ORDER — VITAMIN K1 10 MG/ML IJ SOLN
10.0000 mg | Freq: Once | INTRAVENOUS | Status: AC
  Administered 2017-05-25: 10 mg via INTRAVENOUS
  Filled 2017-05-25: qty 1

## 2017-05-25 MED ORDER — ACETAMINOPHEN 325 MG PO TABS
650.0000 mg | ORAL_TABLET | Freq: Four times a day (QID) | ORAL | Status: DC | PRN
  Administered 2017-05-26 – 2017-05-27 (×2): 650 mg via ORAL
  Filled 2017-05-25 (×3): qty 2

## 2017-05-25 MED ORDER — SODIUM CHLORIDE 0.9 % IV SOLN
1000.0000 mg | Freq: Once | INTRAVENOUS | Status: AC
  Administered 2017-05-25: 1000 mg via INTRAVENOUS
  Filled 2017-05-25: qty 10

## 2017-05-25 NOTE — ED Provider Notes (Addendum)
[facility] Emergency Department Provider Note       Time seen: ----------------------------------------- 1:28 PM on 05/25/2017 -----------------------------------------     I have reviewed the triage vital signs and the nursing notes.   HISTORY   Chief Complaint No chief complaint on file.    HPI Debra Cantu is a 83 y.o. female with a history of hypertension who presents to the ED for a mechanical fall. patient reportedly tripped and felt a nursing home and is complaining of some sacral pain as well as headache. Patient is on Coumadin so there was concern for head injury. she is complaining of headache and pelvic pain but otherwise denies complaints. The fall occurred just prior to arrival.  Past Medical History:  Diagnosis Date  . Arthritis   . Enlarged heart   . Hypercholesteremia   . Hypertension   . Macular degeneration   . Osteoporosis   . Pulmonary embolism [facility])     Patient Active Problem List   Diagnosis Date Noted  . Costochondritis 07/03/2015  . Chronic diastolic heart failure ([name]) 03/18/2015  . URI (upper respiratory infection) 12/07/2014  . Pulmonary hypertension ([name]) 10/17/2014  . Acute pulmonary embolism ([name]) 10/17/2014  . Obstructive sleep apnea 10/17/2014    Past Surgical History:  Procedure Laterality Date  . CATARACT EXTRACTION  2013  . CHOLECYSTECTOMY  2003  . HERNIA REPAIR  2004  . reconstructive foot surgery  2002  . REPLACEMENT TOTAL KNEE Right   . ruptured disc repair     T4, T5  . VAGINAL HYSTERECTOMY  1978  . VEIN LIGATION Bilateral     Allergies Oxycodone; Gabapentin; Rizatriptan; and Sulfa antibiotics  Social History Social History  Substance Use Topics  . Smoking status: Former Smoker    Packs/day: 0.10    Years: 1.00    Types: Cigarettes    Quit date: 10/17/1954  . Smokeless tobacco: Never Used  . Alcohol use No   Review of Systems Constitutional: Negative for fever. Cardiovascular:  Negative for chest pain. Respiratory: Negative for shortness of breath. Gastrointestinal: Negative for abdominal pain, vomiting and diarrhea. Musculoskeletal:positive for sacral pain Skin: positive for scalp contusion Neurological: positive for headache  All systems negative/normal/unremarkable except as stated in the HPI  ____________________________________________   PHYSICAL EXAM:  VITAL SIGNS: ED Triage Vitals  Enc Vitals Group     BP      Pulse      Resp      Temp      Temp src      SpO2      Weight      Height      Head Circumference      Peak Flow      Pain Score      Pain Loc      Pain Edu?      Excl. in [place]?    Constitutional: Alert and oriented. Well appearing and in no distress. Eyes: Conjunctivae are normal. Normal extraocular movements. ENT   Head: Normocephalic, large right parietal scalp hematoma   Nose: No congestion/rhinnorhea.   Mouth/Throat: Mucous membranes are moist.   Neck: No stridor. Cardiovascular: Normal rate, regular rhythm. No murmurs, rubs, or gallops. Respiratory: Normal respiratory effort without tachypnea nor retractions. Breath sounds are clear and equal bilaterally. No wheezes/rales/rhonchi. Gastrointestinal: Soft and nontender. Normal bowel sounds Musculoskeletal: Nontender with normal range of motion in extremities. No lower extremity tenderness nor edema.unremarkable range of motion of the extremities Neurologic:  Normal speech and language.  No gross focal neurologic deficits are appreciated.  Skin:  Skin is warm, dry and intact. right parietal scalp hematoma Psychiatric: Mood and affect are normal. Speech and behavior are normal.  ____________________________________________  ED COURSE:  Pertinent labs & imaging results that were available during my care of the patient were reviewed by me and considered in my medical decision making (see chart for details). Patient presents for mechanical fall, we will assess with   imaging as indicated. Clinical Course as of May 25 1554  Tue May 25, 2017  1526 patient has become agitated and has had some episodes of vomiting. She also appeared flushed after receiving Tylenol. Some of her symptoms could be to worsening subdural.  [JW]  1527 we have decided to rescan the patient to evaluate for interval change.  [JW]  41 son states she does not want neurosurgery and they will honor her DO NOT RESUSCITATE wishes. She will be admitted here for comfort care  [JW]    Clinical Course User Index [JW] [name], MD   Procedures ____________________________________________   CRITICAL CARE Performed by: [name]   Total critical care time: 30 minutes  Critical care time was exclusive of separately billable procedures and treating other patients.  Critical care was necessary to treat or prevent imminent or life-threatening deterioration.  Critical care was time spent personally by me on the following activities: development of treatment plan with patient and/or surrogate as well as nursing, discussions with consultants, evaluation of patient's response to treatment, examination of patient, obtaining history from patient or surrogate, ordering and performing treatments and interventions, ordering and review of laboratory studies, ordering and review of radiographic studies, pulse oximetry and re-evaluation of patient's condition.   RADIOLOGY Images were viewed by me  CT head, C-spine Sacrum and pelvis x-rays IMPRESSION: 1. Acute intracranial hemorrhage, due to a combination of anterior interhemispheric subdural, adjacent subarachnoid and possible frontal cortical hemorrhage. No significant local mass effect, midline shift or hydrocephalus. 2. Chronic small vessel ischemic changes and chronic calcified/ossified meningioma on the left. 3. No evidence of acute cervical spine fracture, traumatic subluxation or static signs of instability. 4.  Critical Value/emergent results were called by telephone at the time of interpretation on 05/25/2017 at 2:04 pm to Dr. Lenise Arena , who verbally acknowledged these results.  repeat CT shows dramatically worsening right subdural with midline shift and possibly early herniation. ____________________________________________  DIFFERENTIAL DIAGNOSIS   contusion, head injury, subdural hematoma, ICH, skull fracture, pelvic fracture, sacral fracture   FINAL ASSESSMENT AND PLAN  fall, head injury, subdural hematoma with midline shift  Plan: Patient had presented for mechanical fall. Patients imaging was dictated as above. She does have intracranial hemorrhage as well as subdural and adjacent subarachnoid. I have ordered IV vitamin K to correct for her Coumadin. 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 No signs of C-spine fracture. I have discussed with neurosurgery on call at [facility] who has accepted the patient in transfer. during this process she progressively worsened to the point where I ordered a repeat CT scan which was dictated as above. She is a DO NOT RESUSCITATE and on discussing with family as to what they want to do.  family has decided to honor her wishes and she will be DO NOT RESUSCITATE/DO NOT INTUBATE and comfort measures. We will admit her here for palliative care. [name], MD   Note: This note was generated in part or whole with  voice recognition software. Voice recognition is usually quite accurate but there are transcription errors that can and very often do occur. I apologize for any typographical errors that were not detected and corrected.     [name], MD 05/25/17 1429    [name], MD 05/25/17 1540    [name], MD 05/25/17 [phone]

## 2017-05-25 NOTE — ED Notes (Signed)
Patient placed on non-re breather at this time

## 2017-05-25 NOTE — ED Notes (Addendum)
Patient becoming very nauseas at this time, c/o extreme head pain, diaphoretic, redness noted all over body. MD made aware. See [facility]

## 2017-05-25 NOTE — ED Notes (Signed)
Patient becoming agitated. Neuro status changed to disoriented X4. MD made aware. Patient sent for second CT scan to see if bleed has progressed

## 2017-05-25 NOTE — ED Notes (Signed)
PT  NOT  GOING  TO  [place]  ALSO  [facility] ([place])

## 2017-05-25 NOTE — H&P (Signed)
[place] [facility] [place] NAME: Debra Cantu    MR#:  [id number]  DATE OF BIRTH:  03/18/1928  DATE OF ADMISSION:  05/25/2017  PRIMARY CARE PHYSICIAN: Adin Hector, MD   REQUESTING/REFERRING PHYSICIAN: Dr Lenise Arena  CHIEF COMPLAINT:   Chief Complaint  Patient presents with  . Fall    HISTORY OF PRESENT ILLNESS:  Debra Cantu  is a 83 y.o. female presented after a fall.  The patient does take Coumadin at home.  Initial CAT scan did show an acute intracranial hemorrhage combination of anterior and interhemispheric subdural and adjacent subarachnoid hemorrhage.  A couple hours later the patient's mental status worsened and a repeat CT scan was ordered.  This showed interval development of a large right convexity subdural hematoma measuring 1.8 cm causing significant mass-effect upon the right lateral ventricle with 1.2 mm of midline shift to the left.  Right uncal herniation.  ER physician Dr. Jimmye Norman called the patient's son Richardson Landry at [phone].  They decided to honor the patient's wishes of a DO NOT RESUSCITATE.  The transfer to a tertiary care center where neurosurgical consultation can be obtained was canceled.  Patient will be admitted here without neurosurgical intervention.  Hospitalist services were contacted for evaluation.  The patient is unresponsive to sternal rub.  A friend who lives a couple houses down was in the room.  The patient's son called her at the time that I was in the room and I also spoke with him and confirmed comfort measures.  History obtained from epic records.  PAST MEDICAL HISTORY:   Past Medical History:  Diagnosis Date  . Arthritis   . Enlarged heart   . Hypercholesteremia   . Hypertension   . Macular degeneration   . Osteoporosis   . Pulmonary embolism ([place])     PAST SURGICAL HISTORY:   Past Surgical History:  Procedure Laterality Date  . CATARACT EXTRACTION  2013  .  CHOLECYSTECTOMY  2003  . HERNIA REPAIR  2004  . reconstructive foot surgery  2002  . REPLACEMENT TOTAL KNEE Right   . ruptured disc repair     T4, T5  . VAGINAL HYSTERECTOMY  1978  . VEIN LIGATION Bilateral     SOCIAL HISTORY:   Social History  Substance Use Topics  . Smoking status: Former Smoker    Packs/day: 0.10    Years: 1.00    Types: Cigarettes    Quit date: 10/17/1954  . Smokeless tobacco: Never Used  . Alcohol use No    FAMILY HISTORY:   Family History  Problem Relation Age of Onset  . Heart disease Brother   . Stroke Father   . Alzheimer's disease Mother   . Cancer Sister        skin    DRUG ALLERGIES:   Allergies  Allergen Reactions  . Oxycodone     Other reaction(s): Hallucination  . Gabapentin     Other reaction(s): Headache  . Rizatriptan     Other reaction(s): Other (See Comments) sedation  . Sulfa Antibiotics     Other reaction(s): Unknown    REVIEW OF SYSTEMS:  Unable to be provided at this time secondary to altered mental status  MEDICATIONS AT HOME:   Prior to Admission medications   Medication Sig Start Date End Date Taking? Authorizing Provider  telmisartan (MICARDIS) 80 MG tablet Take 40 mg by mouth daily. 03/19/17  Yes [provider]  acetaminophen (TYLENOL) 325 MG tablet  Take 650 mg by mouth every 6 (six) hours as needed.    [provider]  amLODipine (NORVASC) 5 MG tablet Take 2.5 mg by mouth daily.    [provider]  atorvastatin (LIPITOR) 80 MG tablet Take 40 mg by mouth daily.     [provider]  beta carotene 15 MG capsule Take 15 mg by mouth daily.    [provider]  carboxymethylcellulose (REFRESH PLUS) 0.5 % SOLN 1 drop 2 (two) times daily as needed.    [provider]  cholecalciferol (VITAMIN D) 400 UNITS TABS tablet Take 400 Units by mouth daily.    [provider]  glucosamine-chondroitin 500-400 MG tablet Take 1 tablet by mouth daily.    [provider]  levothyroxine (SYNTHROID, LEVOTHROID) 88 MCG tablet Take 88 mcg by mouth daily before breakfast.    [provider]  polyethylene glycol (MIRALAX / GLYCOLAX) packet Take 17 g by mouth daily.    [provider]  valsartan (DIOVAN) 320 MG tablet Take 320 mg by mouth daily.    [provider]  venlafaxine (EFFEXOR) 37.5 MG tablet Take by mouth. 06/30/16   [provider]  warfarin (COUMADIN) 4 MG tablet Take 5 mg by mouth daily.     [provider]      VITAL SIGNS:  Blood pressure (!) 179/89, pulse 70, temperature 97.6 F (36.4 C), temperature source Oral, resp. rate 19, height 4\' 11"  (1.499 m), weight 75.8 kg (167 lb), SpO2 96 %.  PHYSICAL EXAMINATION:  GENERAL:  83 y.o.-year-old patient lying in the bed with no acute distress.  EYES: Pupils equal, round, reactive to light and accommodation. No scleral icterus.  HEENT: Unable to look into mouth.  Knot on the back of her head where she hit. NECK:  Supple, no jugular venous distention. No thyroid enlargement, no tenderness.  LUNGS: Normal breath sounds bilaterally, no wheezing, rales,rhonchi or crepitation. No use of accessory muscles of respiration.  CARDIOVASCULAR: S1, S2 normal. No murmurs, rubs, or gallops.  ABDOMEN: Soft, nontender, nondistended. Bowel sounds present. No organomegaly or mass.  EXTREMITIES: No pedal edema, cyanosis, or clubbing.  NEUROLOGIC: Patient unresponsive to sternal rub.  Babinski upgoing bilaterally. PSYCHIATRIC: The patient is unresponsive.  SKIN: No rash, lesion, or ulcer.   LABORATORY PANEL:   CBC  Recent Labs Lab 05/25/17 1410  WBC 7.6  HGB 14.4  HCT 44.2  PLT 183   ------------------------------------------------------------------------------------------------------------------  Chemistries   Recent Labs Lab 05/25/17 1410  NA 138  K 4.5  CL 104  CO2 29  GLUCOSE 114*  BUN 13  CREATININE 0.68  CALCIUM 9.9  AST 27  ALT 26   ALKPHOS 84  BILITOT 0.5   ------------------------------------------------------------------------------------------------------------------   RADIOLOGY:  Dg Pelvis 1-2 Views  Result Date: 05/25/2017 CLINICAL DATA:  Coccygeal pain after fall today. EXAM: PELVIS - 1-2 VIEW COMPARISON:  None. FINDINGS: There is no evidence of pelvic fracture or diastasis. No pelvic bone lesions are seen. Degenerative changes seen involving the inferior portion of the right sacroiliac joint. IMPRESSION: Degenerative change involving right sacroiliac joint. No acute abnormality seen in the pelvis. Electronically Signed   By: Marijo Conception, M.D.   On: 05/25/2017 14:22   Dg Sacrum/coccyx  Result Date: 05/25/2017 CLINICAL DATA:  Coccygeal pain after fall today. EXAM: SACRUM AND COCCYX - 2+ VIEW COMPARISON:  None. FINDINGS: There is no evidence of fracture or other focal bone lesions. Left sacroiliac joint appears normal. Severe degenerative changes  seen involving right sacroiliac joint. 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 No acute abnormality seen in the sacrum or coccyx. Electronically Signed   By: Marijo Conception, M.D.   On: 05/25/2017 14:24   Ct Head Wo Contrast  Result Date: 05/25/2017 CLINICAL DATA:  83 year old female with acute mental status changes. Subsequent encounter. EXAM: CT HEAD WITHOUT CONTRAST TECHNIQUE: Contiguous axial images were obtained from the base of the skull through the vertex without intravenous contrast. COMPARISON:  05/25/2017 1:39 p.m. CT. FINDINGS: Brain: Interval development of large right convexity subdural hematoma measuring up to 1.8 cm causing significant mass effect upon the right lateral ventricle with 1.2 mm of midline shift to the left with early trapping of the left lateral ventricle. Right uncal herniation. Enlarging right parafalcine subdural hematoma now measuring 1.1 cm maximal thickness versus prior 0.8 cm. Right frontal lobe  hemorrhagic contusion. Frontal region bilateral subarachnoid blood. Left frontal-temporal extra-axial 5.6 x 2.4 x 4.2 cm mass suggestive of a meningioma with local mass effect unchanged. Vascular: Vascular calcifications. Skull: No skull fracture. Sinuses/Orbits: No acute orbital abnormality. Visualized paranasal sinuses are clear. Other: Enlarging right parietal subcutaneous hematoma. IMPRESSION: Interval development of large right convexity subdural hematoma measuring up to 1.8 cm causing significant mass effect upon the right lateral ventricle with 1.2 mm of midline shift to the left with early trapping of the left lateral ventricle. Right uncal herniation. Enlarging right parafalcine subdural hematoma now measuring 1.1 cm maximal thickness versus prior 0.8 cm. Right frontal lobe hemorrhagic contusion. Frontal region bilateral subarachnoid blood. Left frontal-temporal extra-axial 5.6 x 2.4 x 4.2 cm mass suggestive of a meningioma with local mass effect unchanged. Enlarging right parietal subcutaneous hematoma. No underlying skull fracture. These results were called by telephone at the time of interpretation on 05/25/2017 at 3:28 pm to Dr. Lenise Arena , who verbally acknowledged these results. Electronically Signed   By: Genia Del M.D.   On: 05/25/2017 15:48   Ct Head Wo Contrast  Result Date: 05/25/2017 CLINICAL DATA:  Fall in shower this morning. No loss of consciousness. Headache and neck pain. EXAM: CT HEAD WITHOUT CONTRAST CT CERVICAL SPINE WITHOUT CONTRAST TECHNIQUE: Multidetector CT imaging of the head and cervical spine was performed following the standard protocol without intravenous contrast. Multiplanar CT image reconstructions of the cervical spine were also generated. COMPARISON:  CT head 03/28/2016. FINDINGS: CT HEAD FINDINGS Brain: There is a new small anterior interhemispheric subdural hematoma which measures up to 8 mm in thickness. Acute extra-axial hemorrhage is also present along  both frontal convexities, right-greater-than-left, likely mostly subarachnoid blood. There may be a small amount of associated cortical contusion. There is no intraventricular hemorrhage, hydrocephalus or new midline shift. Chronic ossified extra-axial mass along the left frontal and temporal lobes is unchanged, measuring up to 5.6 x 2.0 cm transverse. There is mild chronic small vessel ischemic change in the periventricular white matter. Vascular: Intracranial vascular calcifications. No evidence of hyperdense vessel. Skull: Intact without acute findings. Sinuses/Orbits: The visualized paranasal sinuses, mastoid air cells and middle ears are clear. No acute orbital findings. Other: Moderate soft tissue swelling in the posterior right parietal scalp. No foreign body. CT CERVICAL SPINE FINDINGS Alignment: Normal. Skull base and vertebrae: No evidence of acute fracture or traumatic subluxation. Soft tissues and spinal canal: No prevertebral fluid or swelling. No visible canal hematoma. Disc levels: There is relatively mild multilevel cervical spondylosis for age. Disc space narrowing and uncinate spurring are greatest at C5-6 and C6-7.  No significant foraminal narrowing. Upper chest: No acute findings. Aortic and great vessel calcification noted. Other: None. IMPRESSION: 1. Acute intracranial hemorrhage, due to a combination of anterior interhemispheric subdural, adjacent subarachnoid and possible frontal cortical hemorrhage. No significant local mass effect, midline shift or hydrocephalus. 2. Chronic small vessel ischemic changes and chronic calcified/ossified meningioma on the left. 3. No evidence of acute cervical spine fracture, traumatic subluxation or static signs of instability. 4. Critical Value/emergent results were called by telephone at the time of interpretation on 05/25/2017 at 2:04 pm to Dr. Lenise Arena , who verbally acknowledged these results. Electronically Signed   By: Richardean Sale M.D.    On: 05/25/2017 14:06   Ct Cervical Spine Wo Contrast  Result Date: 05/25/2017 CLINICAL DATA:  Fall in shower this morning. No loss of consciousness. Headache and neck pain. EXAM: CT HEAD WITHOUT CONTRAST CT CERVICAL SPINE WITHOUT CONTRAST TECHNIQUE: Multidetector CT imaging of the head and cervical spine was performed following the standard protocol without intravenous contrast. Multiplanar CT image reconstructions of the cervical spine were also generated. COMPARISON:  CT head 03/28/2016. FINDINGS: CT HEAD FINDINGS Brain: There is a new small anterior interhemispheric subdural hematoma which measures up to 8 mm in thickness. Acute extra-axial hemorrhage is also present along both frontal convexities, right-greater-than-left, likely mostly subarachnoid blood. There may be a small amount of associated cortical contusion. There is no intraventricular hemorrhage, hydrocephalus or new midline shift. Chronic ossified extra-axial mass along the left frontal and temporal lobes is unchanged, measuring up to 5.6 x 2.0 cm transverse. There is mild chronic small vessel ischemic change in the periventricular white matter. Vascular: Intracranial vascular calcifications. No evidence of hyperdense vessel. Skull: Intact without acute findings. Sinuses/Orbits: The visualized paranasal sinuses, mastoid air cells and middle ears are clear. No acute orbital findings. Other: Moderate soft tissue swelling in the posterior right parietal scalp. No foreign body. CT CERVICAL SPINE FINDINGS Alignment: Normal. Skull base and vertebrae: No evidence of acute fracture or traumatic subluxation. Soft tissues and spinal canal: No prevertebral fluid or swelling. No visible canal hematoma. Disc levels: There is relatively mild multilevel cervical spondylosis for age. Disc space narrowing and uncinate spurring are greatest at C5-6 and C6-7. No significant foraminal narrowing. Upper chest: No acute findings. Aortic and great vessel calcification  noted. Other: None. IMPRESSION: 1. Acute intracranial hemorrhage, due to a combination of anterior interhemispheric subdural, adjacent subarachnoid and possible frontal cortical hemorrhage. No significant local mass effect, midline shift or hydrocephalus. 2. Chronic small vessel ischemic changes and chronic calcified/ossified meningioma on the left. 3. No evidence of acute cervical spine fracture, traumatic subluxation or static signs of instability. 4. Critical Value/emergent results were called by telephone at the time of interpretation on 05/25/2017 at 2:04 pm to Dr. Lenise Arena , who verbally acknowledged these results. Electronically Signed   By: Richardean Sale M.D.   On: 05/25/2017 14:06      IMPRESSION AND PLAN:   1.  Subdural hematoma and subarachnoid hemorrhage from fall.  Repeat CT scan worsening with herniation.  Patient is a DNR.  Patient will be admitted for comfort measures.  Patient on 100% nonrebreather.  As needed morphine.  Empiric Keppra to prevent seizure.  As needed nausea medications. 2.  History of pulmonary embolism on Coumadin.  ER physician did give vitamin K 3.  History of essential hypertension.  Hold medications at this time. 4.  History of hyperlipidemia.  Hold Lipitor at this time 5.  History of hypothyroidism.  Hold Synthroid at this time 6.  Please call son Remo Lipps at [phone] with any updates  All the records are reviewed and case discussed with ED provider. Management plans discussed with the patient, family and they are in agreement.  CODE STATUS: DNR  TOTAL TIME TAKING CARE OF THIS PATIENT: 40 minutes.    Loletha Grayer M.D on 05/25/2017 at 4:08 PM  Between 7am to 6pm - Pager - [phone]  After 6pm call admission pager [phone]  [employer] [facility] Office  [phone]  CC: Primary care physician; Adin Hector, MD

## 2017-05-26 ENCOUNTER — Inpatient Hospital Stay: Payer: Medicare Other

## 2017-05-26 LAB — PROTIME-INR
INR: 1.28
PROTHROMBIN TIME: 15.9 s — AB (ref 11.4–15.2)

## 2017-05-26 NOTE — Progress Notes (Signed)
Nutrition Brief Note  Patient identified to be seen for low Braden score. Chart reviewed. Patient now transitioning to comfort care.   NPO at this time. No nutrition interventions warranted at this time.  Please consult RD as needed.   Willey Blade, MS, [place], LDN Office: [phone] Pager: [phone] After Hours/Weekend Pager: [phone]

## 2017-05-26 NOTE — Consult Note (Signed)
Referring Physician:  No referring provider defined for this encounter.  Primary Physician:  Adin Hector, MD  Chief Complaint:  Subdural hematoma History of Present Illness: Debra Cantu is a 83 y.o. female who presents as a consult for subdural hematoma/headache. Patient arousable but medicated and sleepy. History obtained from family present in the room and POA. Patient slipped on a rug in the bathroom yesterday and hit her head. Ct scans revealed 8 mm subdural hematoma, later scans revealed increase to 1.8. Dr. Aris Lot was consulted but family only wished for comfort care at that time. Repeat CT today reveals improvement of hematoma to 10mm and family has many questions concerning possible deficits and if intervention would improve deficits.   Review of Systems:  A 10 point review of systems is negative, except for the pertinent positives and negatives detailed in the HPI.  Past Medical History: Past Medical History:  Diagnosis Date  . Arthritis   . Enlarged heart   . Hypercholesteremia   . Hypertension   . Macular degeneration   . Osteoporosis   . Pulmonary embolism [facility])     Past Surgical History: Past Surgical History:  Procedure Laterality Date  . CATARACT EXTRACTION  2013  . CHOLECYSTECTOMY  2003  . HERNIA REPAIR  2004  . reconstructive foot surgery  2002  . REPLACEMENT TOTAL KNEE Right   . ruptured disc repair     T4, T5  . VAGINAL HYSTERECTOMY  1978  . VEIN LIGATION Bilateral     Allergies: Allergies as of 05/25/2017 - Review Complete 05/25/2017  Allergen Reaction Noted  . Oxycodone  02/01/2012  . Gabapentin  10/16/2015  . Rizatriptan  11/17/2013  . Sulfa antibiotics      Medications:  Current Facility-Administered Medications:  .  acetaminophen (TYLENOL) tablet 650 mg, 650 mg, Oral, Q6H PRN **OR** acetaminophen (TYLENOL) suppository 650 mg, 650 mg, Rectal, Q6H PRN, Leslye Peer, Richard, MD .  levETIRAcetam (KEPPRA) 500 mg in sodium chloride 0.9 % 100 mL  IVPB, 500 mg, Intravenous, Q12H, Loletha Grayer, MD, Stopped at 05/26/17 1212 .  metoCLOPramide (REGLAN) injection 10 mg, 10 mg, Intravenous, Once, [name], MD .  morphine 2 MG/ML injection 2 mg, 2 mg, Intravenous, Q1H PRN, Loletha Grayer, MD, 2 mg at 05/26/17 1317 .  ondansetron (ZOFRAN) injection 4 mg, 4 mg, Intravenous, Q6H PRN, Loletha Grayer, MD, 4 mg at 05/26/17 0745 .  scopolamine (TRANSDERM-SCOP) 1 MG/3DAYS 1.5 mg, 1 patch, Transdermal, Q72H, Wieting, Richard, MD, 1.5 mg at 05/25/17 2010   Social History: Social History  Substance Use Topics  . Smoking status: Former Smoker    Packs/day: 0.10    Years: 1.00    Types: Cigarettes    Quit date: 10/17/1954  . Smokeless tobacco: Never Used  . Alcohol use No    Family Medical History: Family History  Problem Relation Age of Onset  . Heart disease Brother   . Stroke Father   . Alzheimer's disease Mother   . Cancer Sister        skin    Physical Examination: Vitals:   05/25/17 2257 05/26/17 0319  BP:  (!) 167/66  Pulse:  66  Resp:  20  Temp:  98.1 F (36.7 C)  SpO2: (!) 88% 92%      General: Patient is well developed, well nourished, sleepy but arousable for short periods of time.    Psychiatric: Patient is non-anxious.  Respiratory: Patient is breathing without any difficulty.  Skin:   On exposed  skin, there are no abnormal skin lesions.  NEUROLOGICAL:  General: In no acute distress.   Awake, alert, oriented to person, place.  Pupils equal round and reactive to light.  Facial tone is symmetric. Strength: Side Biceps Triceps Deltoid Grip  R 5 5 5 5   L 5 5 5 5   Bilateral upper extremity sensation is intact to light touch and pin prick.  Clonus is not present.  Toes are down-going.  Imaging: EXAM: CT HEAD WITHOUT CONTRAST  CT CERVICAL SPINE WITHOUT CONTRAST  TECHNIQUE: Multidetector CT imaging of the head and cervical spine was performed following the standard protocol without  intravenous contrast. Multiplanar CT image reconstructions of the cervical spine were also generated.  COMPARISON:  CT head 03/28/2016.  FINDINGS: CT HEAD FINDINGS  Brain: There is a new small anterior interhemispheric subdural hematoma which measures up to 8 mm in thickness. Acute extra-axial hemorrhage is also present along both frontal convexities, right-greater-than-left, likely mostly subarachnoid blood. There may be a small amount of associated cortical contusion. There is no intraventricular hemorrhage, hydrocephalus or new midline shift. Chronic ossified extra-axial mass along the left frontal and temporal lobes is unchanged, measuring up to 5.6 x 2.0 cm transverse. There is mild chronic small vessel ischemic change in the periventricular white matter.  Vascular: Intracranial vascular calcifications. No evidence of hyperdense vessel.  Skull: Intact without acute findings.  Sinuses/Orbits: The visualized paranasal sinuses, mastoid air cells and middle ears are clear. No acute orbital findings.  Other: Moderate soft tissue swelling in the posterior right parietal scalp. No foreign body.  CT CERVICAL SPINE FINDINGS  Alignment: Normal.  Skull base and vertebrae: No evidence of acute fracture or traumatic subluxation.  Soft tissues and spinal canal: No prevertebral fluid or swelling. No visible canal hematoma.  Disc levels: There is relatively mild multilevel cervical spondylosis for age. Disc space narrowing and uncinate spurring are greatest at C5-6 and C6-7. No significant foraminal narrowing.  Upper chest: No acute findings. Aortic and great vessel calcification noted.  Other: None.  IMPRESSION: 1. Acute intracranial hemorrhage, due to a combination of anterior interhemispheric subdural, adjacent subarachnoid and possible frontal cortical hemorrhage. No significant local mass effect, midline shift or hydrocephalus. 2. Chronic small vessel  ischemic changes and chronic calcified/ossified meningioma on the left. 3. No evidence of acute cervical spine fracture, traumatic subluxation or static signs of instability. 4. Critical Value/emergent results were called by telephone at the time of interpretation on 05/25/2017 at 2:04 pm to Dr. Lenise Arena , who verbally acknowledged these results.   Electronically Signed   By: Richardean Sale M.D.   On: 05/25/2017 14:06  EXAM: CT HEAD WITHOUT CONTRAST  TECHNIQUE: Contiguous axial images were obtained from the base of the skull through the vertex without intravenous contrast.  COMPARISON:  05/25/2017 1:39 p.m. CT.  FINDINGS: Brain: Interval development of large right convexity subdural hematoma measuring up to 1.8 cm causing significant mass effect upon the right lateral ventricle with 1.2 mm of midline shift to the left with early trapping of the left lateral ventricle. Right uncal herniation.  Enlarging right parafalcine subdural hematoma now measuring 1.1 cm maximal thickness versus prior 0.8 cm.  Right frontal lobe hemorrhagic contusion. Frontal region bilateral subarachnoid blood.  Left frontal-temporal extra-axial 5.6 x 2.4 x 4.2 cm mass suggestive of a meningioma with local mass effect unchanged.  Vascular: Vascular calcifications.  Skull: No skull fracture.  Sinuses/Orbits: No acute orbital abnormality. Visualized paranasal sinuses are clear.  Other: Enlarging right parietal  subcutaneous hematoma.  IMPRESSION: Interval development of large right convexity subdural hematoma measuring up to 1.8 cm causing significant mass effect upon the right lateral ventricle with 1.2 mm of midline shift to the left with early trapping of the left lateral ventricle. Right uncal herniation.  Enlarging right parafalcine subdural hematoma now measuring 1.1 cm maximal thickness versus prior 0.8 cm.  Right frontal lobe hemorrhagic contusion. Frontal  region bilateral subarachnoid blood.  Left frontal-temporal extra-axial 5.6 x 2.4 x 4.2 cm mass suggestive of a meningioma with local mass effect unchanged.  Enlarging right parietal subcutaneous hematoma. No underlying skull fracture.  These results were called by telephone at the time of interpretation on 05/25/2017 at 3:28 pm to Dr. Lenise Arena , who verbally acknowledged these results.   Electronically Signed   By: Genia Del M.D.   On: 05/25/2017 15:48 EXAM: CT HEAD WITHOUT CONTRAST  TECHNIQUE: Contiguous axial images were obtained from the base of the skull through the vertex without intravenous contrast.  COMPARISON:  Head CT 05/25/2017 at 3:22 p.m.  Brain MRI 01/02/2011  FINDINGS: Brain: The component of the right hemispheric subdural hematoma overlying the right convexity is decreased in size and now measures 10 mm, previously 17 mm. Leftward midline shift has greatly improved, now measuring 4 mm. Concentrated blood at the right anterior aspect of the falx cerebri is unchanged. There is a small amount of subarachnoid blood along the right frontal and parietal convexities. There is also a small amount of left frontal subarachnoid blood. Densely calcified left convexity meningioma is unchanged. Previously seen herniation has resolved. Basal cisterns are patent. No hydrocephalus or ventricular trapping.  Vascular: No hyperdense vessel or unexpected calcification.  Skull: Right parieto-occipital scalp hematoma.  No skull fracture.  Sinuses/Orbits: No sinus fluid levels or advanced mucosal thickening. No mastoid effusion. Normal orbits.  IMPRESSION: 1. Decreased thickness of right convexity subdural hematoma, owing either to true decrease in size or redistribution of blood. The previously seen midline shift has greatly improved, now measuring just 4 mm, and the herniation has resolved. 2. Trace areas of subarachnoid blood overlying the  right frontal, right parietal and left frontal lobes. This is a very small amount of blood. 3. Unchanged appearance of densely calcified left convexity extra-axial mass, consistent with meningioma.   Electronically Signed   By: Ulyses Jarred M.D.   On: 05/26/2017 03:22  Assessment and Plan: Ms. Mckissack is a pleasant 83 y.o. female with subdural ;hematoma that is showing improvement since initial presentation. Imaging has significantly improved. Son and Dr. Izora Ribas spoke extensively about options available at this time. Surgical intervention did not seem appropriate at this time. Son expressed understanding and all questions were answered.  Physical exam was severely limited to due sleepiness of patient.   Marin Olp, PA-C Dept. of Neurosurgery

## 2017-05-26 NOTE — Plan of Care (Signed)
Problem: Pain Managment: Goal: General experience of comfort will improve Outcome: Progressing When awake, pt complains of head ache.  Morphine 1 mg IV given x 4 this shift.  Zofran x 1 with 3 bouts of emesis.

## 2017-05-26 NOTE — Progress Notes (Signed)
Pt requires a low bed secondary to fall, bleed, and osteoporosis.  Family is in the room and requests that pt not be moved at this time because she is sleeping and comfortable. Dorna Bloom RN

## 2017-05-26 NOTE — Progress Notes (Addendum)
Informed by nursing early on in the shift that the patient was more alert, largely oriented.  Patient's son is a pediatrician and initially given the patient's large subdural bleeds with some midline shift had made the patient comfort care, but upon hearing that she was more alert and even somewhat cognizant is considering whether or not intervention might be beneficial for the patient.  He is currently on his way here with other family members.  I went to see the patient and she was largely lethargic, but had just been given a dose of morphine for her headache.  Patient's neighbor was in the room and stated the patient had been awake earlier, had recognized her and called her by name, and had conversed with her some.  Nursing states that patient was spontaneously moving right upper and lower extremities, but was interacting much less on the left side.  Desire for intervention will need to be discussed with family members when they arrive.  Initial intervention would equally include transfer to ICU with central line placement for infusion of something like mannitol or hypertonic saline drip to try and reduce some of her intracranial pressure.  She would then of course need transfer to a facility with neurosurgical intervention capability.  Once family members arrive we can discuss this in detail with them and come to a decision.  Update -son, Yassmin Binegar, arrived and conversation was held with him about his mother's current condition and prognosis as well as potential interventions.  Workup for him, recognize them, and spoke to him somewhat lucidly.  In conversing with him decided we would recheck her INR to be sure that it is normalized, repeat head CT to see if her bleeding seems to have stopped, and then decide whether or not he and his other family members feel like she would want more aggressive intervention.  Follow up on this signed out to Dr Estanislado Pandy, ICU team aware of patient.  Jacqulyn Bath [facility] 05/26/2017, 12:34 AM

## 2017-05-26 NOTE — Evaluation (Signed)
Clinical/Bedside Swallow Evaluation Patient Details  Name: Debra Cantu MRN: [id number] Date of Birth: [birth year]  Today's Date: 05/26/2017 Time: SLP Start Time (ACUTE ONLY): 43 SLP Stop Time (ACUTE ONLY): 1630 SLP Time Calculation (min) (ACUTE ONLY): 60 min  Past Medical History:  Past Medical History:  Diagnosis Date  . Arthritis   . Enlarged heart   . Hypercholesteremia   . Hypertension   . Macular degeneration   . Osteoporosis   . Pulmonary embolism [facility])    Past Surgical History:  Past Surgical History:  Procedure Laterality Date  . CATARACT EXTRACTION  2013  . CHOLECYSTECTOMY  2003  . HERNIA REPAIR  2004  . reconstructive foot surgery  2002  . REPLACEMENT TOTAL KNEE Right   . ruptured disc repair     T4, T5  . VAGINAL HYSTERECTOMY  1978  . VEIN LIGATION Bilateral    HPI:      Assessment / Plan / Recommendation Clinical Impression  Pt appears to present w/ adequate oropharyngeal phase swallow function and awareness for oral intake w/ reduced risk for aspiration when following general aspiration precautions AND giving po's only when fully awake/alert to safely participate. Pt verbalized and indicated desire for oral intake. She participated in holding cup to drink w/ only min support given by SLP/grandson to steady the cup. She consumed po trials of thin liquids via Cup and purees as well as single ice chips w/ no overt s/s of aspiration noted; no decline in respiratory status or decline in vocal quality noted b/t trials. Oral phase appeared wfl for bolus management and timely A-P transfer then clearing of all bolus consistencies given - no solids were assessed d/t pt's overall drowsiness, weakness. Pt does exhibit min-mod drowsiness but responded w/ alertness given verbal/tactile cues intermittently. Educated family members present to given po's ONLY when fully alert/awake to safely participate; otherwise, let pt sleep and try later. Also educated on general aspiration  precautions; sign posted above bed. Recommend a pureed diet w/ thin liquids at this time to initiate oral diet.  SLP Visit Diagnosis: Dysphagia, oropharyngeal phase (R13.12) (drowsy; weak)    Aspiration Risk  Mild aspiration risk (d/t drowsy, Neuro event)    Diet Recommendation  Dysphagia level 1(puree) w/ Thin liquids via CUP; aspiration precautions; support at meals  Medication Administration: Crushed with puree    Other  Recommendations Recommended Consults:  (Dietician f/u) Oral Care Recommendations: Oral care BID;Staff/trained caregiver to provide oral care   Follow up Recommendations  (TBD)      Frequency and Duration min 2x/week  1 week       Prognosis Prognosis for Safe Diet Advancement: Good Barriers to Reach Goals:  (drowsy)      Swallow Study   General Date of Onset: 05/25/17 Type of Study: Bedside Swallow Evaluation Previous Swallow Assessment: none reported Diet Prior to this Study: Regular;Thin liquids Temperature Spikes Noted: No (wbc not elevated) Respiratory Status: Nasal cannula (2-3 liters) History of Recent Intubation: No Behavior/Cognition: Cooperative;Pleasant mood;Lethargic/Drowsy;Requires cueing (awake sometimes w/ cues) Oral Cavity Assessment: Dry Oral Care Completed by SLP: Recent completion by staff Oral Cavity - Dentition: Adequate natural dentition Vision: Functional for self-feeding (but drowsy at times and needed support) Self-Feeding Abilities: Able to feed self;Needs assist;Needs set up;Total assist Patient Positioning: Upright in bed Baseline Vocal Quality: Normal;Low vocal intensity Volitional Cough: Strong (wfl) Volitional Swallow: Able to elicit    Oral/Motor/Sensory Function Overall Oral Motor/Sensory Function: Within functional limits (w/ bolus management and speech)   Ice Chips  Ice chips: Within functional limits Presentation: Spoon (5 trials)   Thin Liquid Thin Liquid: Within functional limits Presentation: Cup;Self Fed  (supported; 5 trials total) Other Comments: requested another sip of water at end    Nectar Thick Nectar Thick Liquid: Not tested   Honey Thick Honey Thick Liquid: Not tested   Puree Puree: Within functional limits Presentation: Spoon (fed; 6 trials total)   Solid   GO   Solid: Not tested Other Comments: too drowsy, weak    Functional Assessment Tool Used: clinical judgement Functional Limitations: Swallowing Swallow Current Status (S1115): At least 1 percent but less than 20 percent impaired, limited or restricted Swallow Goal Status [phone]): At least 1 percent but less than 20 percent impaired, limited or restricted Swallow Discharge Status [phone]): At least 1 percent but less than 20 percent impaired, limited or restricted     Orinda Kenner, MS, CCC-SLP [facility]ette [facility] 05/26/2017,4:30 PM

## 2017-05-26 NOTE — Progress Notes (Signed)
[place] at [place]: Debra Cantu    MR#:  [id number]  DATE OF BIRTH:  [birth year]  SUBJECTIVE:  CHIEF COMPLAINT:   Chief Complaint  Patient presents with  . Fall    came with subdural hematoma- after a fall. Was on coumadin. Noted some midline shift, had some worsening overnight but then started improving. More alert and answering simple questions now, complaining of some headache. REVIEW OF SYSTEMS:  CONSTITUTIONAL: No fever, positive for fatigue or weakness.  EYES: No blurred or double vision.  EARS, NOSE, AND THROAT: No tinnitus or ear pain.  RESPIRATORY: No cough, shortness of breath, wheezing or hemoptysis.  CARDIOVASCULAR: No chest pain, orthopnea, edema.  GASTROINTESTINAL: No nausea, vomiting, diarrhea or abdominal pain.  GENITOURINARY: No dysuria, hematuria.  ENDOCRINE: No polyuria, nocturia,  HEMATOLOGY: No anemia, easy bruising or bleeding SKIN: No rash or lesion. MUSCULOSKELETAL: No joint pain or arthritis.   NEUROLOGIC: No tingling, numbness, have generalized weakness.  PSYCHIATRY: No anxiety or depression.   ROS  DRUG ALLERGIES:   Allergies  Allergen Reactions  . Oxycodone     Other reaction(s): Hallucination  . Gabapentin     Other reaction(s): Headache  . Rizatriptan     Other reaction(s): Other (See Comments) sedation  . Sulfa Antibiotics     Other reaction(s): Unknown    VITALS:  Blood pressure (!) 167/66, pulse 66, temperature 98.1 F (36.7 C), resp. rate 20, height 4\' 11"  (1.499 m), weight 75.8 kg (167 lb), SpO2 92 %.  PHYSICAL EXAMINATION:  GENERAL:  83 y.o.-year-old patient lying in the bed with no acute distress.  EYES: Pupils equal, round, reactive to light and accommodation. No scleral icterus. Extraocular muscles intact.  HEENT: Head atraumatic, normocephalic. Oropharynx and nasopharynx clear.  NECK:  Supple, no jugular venous distention. No thyroid enlargement, no tenderness.  LUNGS:  Normal breath sounds bilaterally, no wheezing, rales,rhonchi or crepitation. No use of accessory muscles of respiration.  CARDIOVASCULAR: S1, S2 normal. No murmurs, rubs, or gallops.  ABDOMEN: Soft, nontender, nondistended. Bowel sounds present. No organomegaly or mass.  EXTREMITIES: No pedal edema, cyanosis, or clubbing.  NEUROLOGIC: Cranial nerves II through XII are intact. Muscle strength 3/5 in all extremities. Sensation intact. Gait not checked.  PSYCHIATRIC: The patient is alert and oriented x 2.  SKIN: No obvious rash, lesion, or ulcer.   Physical Exam LABORATORY PANEL:   CBC  Recent Labs Lab 05/25/17 1410  WBC 7.6  HGB 14.4  HCT 44.2  PLT 183   ------------------------------------------------------------------------------------------------------------------  Chemistries   Recent Labs Lab 05/25/17 1410  NA 138  K 4.5  CL 104  CO2 29  GLUCOSE 114*  BUN 13  CREATININE 0.68  CALCIUM 9.9  AST 27  ALT 26  ALKPHOS 84  BILITOT 0.5   ------------------------------------------------------------------------------------------------------------------  Cardiac Enzymes No results for input(s): TROPONINI in the last 168 hours. ------------------------------------------------------------------------------------------------------------------  RADIOLOGY:  Dg Pelvis 1-2 Views  Result Date: 05/25/2017 CLINICAL DATA:  Coccygeal pain after fall today. EXAM: PELVIS - 1-2 VIEW COMPARISON:  None. FINDINGS: There is no evidence of pelvic fracture or diastasis. No pelvic bone lesions are seen. Degenerative changes seen involving the inferior portion of the right sacroiliac joint. IMPRESSION: Degenerative change involving right sacroiliac joint. No acute abnormality seen in the pelvis. Electronically Signed   By: Marijo Conception, M.D.   On: 05/25/2017 14:22   Dg Sacrum/coccyx  Result Date: 05/25/2017 CLINICAL DATA:  Coccygeal pain after fall today. EXAM:  SACRUM AND COCCYX - 2+ VIEW  COMPARISON:  None. FINDINGS: There is no evidence of fracture or other focal bone lesions. Left sacroiliac joint appears normal. Severe degenerative changes seen involving right sacroiliac joint. 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 No acute abnormality seen in the sacrum or coccyx. Electronically Signed   By: Marijo Conception, M.D.   On: 05/25/2017 14:24   Ct Head Wo Contrast  Result Date: 05/26/2017 CLINICAL DATA:  Subdural hematoma follow-up EXAM: CT HEAD WITHOUT CONTRAST TECHNIQUE: Contiguous axial images were obtained from the base of the skull through the vertex without intravenous contrast. COMPARISON:  Head CT 05/25/2017 at 3:22 p.m. Brain MRI 01/02/2011 FINDINGS: Brain: The component of the right hemispheric subdural hematoma overlying the right convexity is decreased in size and now measures 10 mm, previously 17 mm. Leftward midline shift has greatly improved, now measuring 4 mm. Concentrated blood at the right anterior aspect of the falx cerebri is unchanged. There is a small amount of subarachnoid blood along the right frontal and parietal convexities. There is also a small amount of left frontal subarachnoid blood. Densely calcified left convexity meningioma is unchanged. Previously seen herniation has resolved. Basal cisterns are patent. No hydrocephalus or ventricular trapping. Vascular: No hyperdense vessel or unexpected calcification. Skull: Right parieto-occipital scalp hematoma.  No skull fracture. Sinuses/Orbits: No sinus fluid levels or advanced mucosal thickening. No mastoid effusion. Normal orbits. IMPRESSION: 1. Decreased thickness of right convexity subdural hematoma, owing either to true decrease in size or redistribution of blood. The previously seen midline shift has greatly improved, now measuring just 4 mm, and the herniation has resolved. 2. Trace areas of subarachnoid blood overlying the right frontal, right parietal and left frontal lobes. This  is a very small amount of blood. 3. Unchanged appearance of densely calcified left convexity extra-axial mass, consistent with meningioma. Electronically Signed   By: Ulyses Jarred M.D.   On: 05/26/2017 03:22   Ct Head Wo Contrast  Result Date: 05/25/2017 CLINICAL DATA:  83 year old female with acute mental status changes. Subsequent encounter. EXAM: CT HEAD WITHOUT CONTRAST TECHNIQUE: Contiguous axial images were obtained from the base of the skull through the vertex without intravenous contrast. COMPARISON:  05/25/2017 1:39 p.m. CT. FINDINGS: Brain: Interval development of large right convexity subdural hematoma measuring up to 1.8 cm causing significant mass effect upon the right lateral ventricle with 1.2 mm of midline shift to the left with early trapping of the left lateral ventricle. Right uncal herniation. Enlarging right parafalcine subdural hematoma now measuring 1.1 cm maximal thickness versus prior 0.8 cm. Right frontal lobe hemorrhagic contusion. Frontal region bilateral subarachnoid blood. Left frontal-temporal extra-axial 5.6 x 2.4 x 4.2 cm mass suggestive of a meningioma with local mass effect unchanged. Vascular: Vascular calcifications. Skull: No skull fracture. Sinuses/Orbits: No acute orbital abnormality. Visualized paranasal sinuses are clear. Other: Enlarging right parietal subcutaneous hematoma. IMPRESSION: Interval development of large right convexity subdural hematoma measuring up to 1.8 cm causing significant mass effect upon the right lateral ventricle with 1.2 mm of midline shift to the left with early trapping of the left lateral ventricle. Right uncal herniation. Enlarging right parafalcine subdural hematoma now measuring 1.1 cm maximal thickness versus prior 0.8 cm. Right frontal lobe hemorrhagic contusion. Frontal region bilateral subarachnoid blood. Left frontal-temporal extra-axial 5.6 x 2.4 x 4.2 cm mass suggestive of a meningioma with local mass effect unchanged. Enlarging  right parietal subcutaneous hematoma. No underlying skull fracture. These results were called by telephone at  the time of interpretation on 05/25/2017 at 3:28 pm to Dr. Lenise Arena , who verbally acknowledged these results. Electronically Signed   By: Genia Del M.D.   On: 05/25/2017 15:48   Ct Head Wo Contrast  Result Date: 05/25/2017 CLINICAL DATA:  Fall in shower this morning. No loss of consciousness. Headache and neck pain. EXAM: CT HEAD WITHOUT CONTRAST CT CERVICAL SPINE WITHOUT CONTRAST TECHNIQUE: Multidetector CT imaging of the head and cervical spine was performed following the standard protocol without intravenous contrast. Multiplanar CT image reconstructions of the cervical spine were also generated. COMPARISON:  CT head 03/28/2016. FINDINGS: CT HEAD FINDINGS Brain: There is a new small anterior interhemispheric subdural hematoma which measures up to 8 mm in thickness. Acute extra-axial hemorrhage is also present along both frontal convexities, right-greater-than-left, likely mostly subarachnoid blood. There may be a small amount of associated cortical contusion. There is no intraventricular hemorrhage, hydrocephalus or new midline shift. Chronic ossified extra-axial mass along the left frontal and temporal lobes is unchanged, measuring up to 5.6 x 2.0 cm transverse. There is mild chronic small vessel ischemic change in the periventricular white matter. Vascular: Intracranial vascular calcifications. No evidence of hyperdense vessel. Skull: Intact without acute findings. Sinuses/Orbits: The visualized paranasal sinuses, mastoid air cells and middle ears are clear. No acute orbital findings. Other: Moderate soft tissue swelling in the posterior right parietal scalp. No foreign body. CT CERVICAL SPINE FINDINGS Alignment: Normal. Skull base and vertebrae: No evidence of acute fracture or traumatic subluxation. Soft tissues and spinal canal: No prevertebral fluid or swelling. No visible canal  hematoma. Disc levels: There is relatively mild multilevel cervical spondylosis for age. Disc space narrowing and uncinate spurring are greatest at C5-6 and C6-7. No significant foraminal narrowing. Upper chest: No acute findings. Aortic and great vessel calcification noted. Other: None. IMPRESSION: 1. Acute intracranial hemorrhage, due to a combination of anterior interhemispheric subdural, adjacent subarachnoid and possible frontal cortical hemorrhage. No significant local mass effect, midline shift or hydrocephalus. 2. Chronic small vessel ischemic changes and chronic calcified/ossified meningioma on the left. 3. No evidence of acute cervical spine fracture, traumatic subluxation or static signs of instability. 4. Critical Value/emergent results were called by telephone at the time of interpretation on 05/25/2017 at 2:04 pm to Dr. Lenise Arena , who verbally acknowledged these results. Electronically Signed   By: Richardean Sale M.D.   On: 05/25/2017 14:06   Ct Cervical Spine Wo Contrast  Result Date: 05/25/2017 CLINICAL DATA:  Fall in shower this morning. No loss of consciousness. Headache and neck pain. EXAM: CT HEAD WITHOUT CONTRAST CT CERVICAL SPINE WITHOUT CONTRAST TECHNIQUE: Multidetector CT imaging of the head and cervical spine was performed following the standard protocol without intravenous contrast. Multiplanar CT image reconstructions of the cervical spine were also generated. COMPARISON:  CT head 03/28/2016. FINDINGS: CT HEAD FINDINGS Brain: There is a new small anterior interhemispheric subdural hematoma which measures up to 8 mm in thickness. Acute extra-axial hemorrhage is also present along both frontal convexities, right-greater-than-left, likely mostly subarachnoid blood. There may be a small amount of associated cortical contusion. There is no intraventricular hemorrhage, hydrocephalus or new midline shift. Chronic ossified extra-axial mass along the left frontal and temporal lobes  is unchanged, measuring up to 5.6 x 2.0 cm transverse. There is mild chronic small vessel ischemic change in the periventricular white matter. Vascular: Intracranial vascular calcifications. No evidence of hyperdense vessel. Skull: Intact without acute findings. Sinuses/Orbits: The visualized paranasal sinuses, mastoid air cells and middle  ears are clear. No acute orbital findings. Other: Moderate soft tissue swelling in the posterior right parietal scalp. No foreign body. CT CERVICAL SPINE FINDINGS Alignment: Normal. Skull base and vertebrae: No evidence of acute fracture or traumatic subluxation. Soft tissues and spinal canal: No prevertebral fluid or swelling. No visible canal hematoma. Disc levels: There is relatively mild multilevel cervical spondylosis for age. Disc space narrowing and uncinate spurring are greatest at C5-6 and C6-7. No significant foraminal narrowing. Upper chest: No acute findings. Aortic and great vessel calcification noted. Other: None. IMPRESSION: 1. Acute intracranial hemorrhage, due to a combination of anterior interhemispheric subdural, adjacent subarachnoid and possible frontal cortical hemorrhage. No significant local mass effect, midline shift or hydrocephalus. 2. Chronic small vessel ischemic changes and chronic calcified/ossified meningioma on the left. 3. No evidence of acute cervical spine fracture, traumatic subluxation or static signs of instability. 4. Critical Value/emergent results were called by telephone at the time of interpretation on 05/25/2017 at 2:04 pm to Dr. Lenise Arena , who verbally acknowledged these results. Electronically Signed   By: Richardean Sale M.D.   On: 05/25/2017 14:06    ASSESSMENT AND PLAN:   Active Problems:   Subdural hematoma (HCC)   1.  Subdural hematoma and subarachnoid hemorrhage from fall.  Repeat CT scan worsening with herniation.  Patient is a DNR.  Patient will be admitted for comfort measures.     As needed morphine.   Empiric Keppra to prevent seizure.  As needed nausea medications.  Now have some improvement in her mental condition and slight decrease in the size of hematoma as per repeat CT scan. I had detailed discussion with patient's son and 2 daughters in the room again this morning, they agreed for now no aggressive measures but would like to speak to the neurosurgeon.  They would like to continue her to be on comfort measures but like to start feeding as she can tolerate and keep her DO NOT RESUSCITATE.  If she continued to stay alert and does not get worse then we may have to consider rehabilitation placement for her.  2.  History of pulmonary embolism on Coumadin.  ER physician did give vitamin K   Will not resume anticoagulation.  3.  History of essential hypertension.  Hold medications at this time.   BP is slightly high normal.  4.  History of hyperlipidemia.  Hold Lipitor at this time 5.  History of hypothyroidism.  Hold Synthroid at this time  spoke to  [place] at [phone]    All the records are reviewed and case discussed with Care Management/Social Workerr. Management plans discussed with the patient, family and they are in agreement.  CODE STATUS: DO NOT RESUSCITATE  TOTAL TIME TAKING CARE OF THIS PATIENT: 35  minutes.     POSSIBLE D/C IN 1-2  DAYS, DEPENDING ON CLINICAL CONDITION.   Vaughan Basta M.D on 05/26/2017   Between 7am to 6pm - Pager - [phone]  After 6pm go to [email or web address] - password [email or web address] [facility]  Office  [phone]  CC: Primary care physician; Adin Hector, MD  Note: This dictation was prepared with Dragon dictation along with smaller phrase technology. Any transcriptional errors that result from this process are unintentional.

## 2017-05-26 NOTE — Progress Notes (Signed)
Following up on repeat CT scan head results . Patient under hospitalist service for subdural and subarachnoid hemorrhage. Repeat CT head results reviewed and discussed with neurosurgery on call DR. BARR from [facility]. Repeat CT head shows decreased subdural hematoma with resolving herniation. Neurosurgery do not recommend any iv mannitol.  Goals of care discussed with family regarding surgical intervention and evacuation of subdural hematoma and transfer to neurosurgical intervention facility as recommended by neurosurgery. Medical condition and treatment options discussed with family members in detail.Patients family do not want any surgical intervention and aggressive intervention. Comfort measures to continue. Neurology to follow up in am. Discussed with RN taking care of patient.

## 2017-05-27 DIAGNOSIS — S065X9A Traumatic subdural hemorrhage with loss of consciousness of unspecified duration, initial encounter: Principal | ICD-10-CM

## 2017-05-27 DIAGNOSIS — S0990XA Unspecified injury of head, initial encounter: Secondary | ICD-10-CM

## 2017-05-27 DIAGNOSIS — R569 Unspecified convulsions: Secondary | ICD-10-CM

## 2017-05-27 DIAGNOSIS — R51 Headache: Secondary | ICD-10-CM

## 2017-05-27 DIAGNOSIS — Z7189 Other specified counseling: Secondary | ICD-10-CM

## 2017-05-27 DIAGNOSIS — R519 Headache, unspecified: Secondary | ICD-10-CM

## 2017-05-27 MED ORDER — BUTALBITAL-APAP-CAFFEINE 50-325-40 MG PO TABS
1.0000 | ORAL_TABLET | Freq: Four times a day (QID) | ORAL | Status: DC | PRN
  Administered 2017-05-27 – 2017-05-28 (×2): 1 via ORAL
  Filled 2017-05-27 (×3): qty 1

## 2017-05-27 MED ORDER — ATORVASTATIN CALCIUM 20 MG PO TABS: 40.0000 mg | ORAL_TABLET | Freq: Every day | ORAL | Status: DC

## 2017-05-27 MED ORDER — SODIUM CHLORIDE 0.9 % IV SOLN
1000.0000 mg | Freq: Two times a day (BID) | INTRAVENOUS | Status: DC
  Administered 2017-05-27: 22:00:00 1000 mg via INTRAVENOUS
  Filled 2017-05-27 (×3): qty 10

## 2017-05-27 MED ORDER — SODIUM CHLORIDE 0.9 % IV SOLN
500.0000 mg | Freq: Once | INTRAVENOUS | Status: AC
  Administered 2017-05-27: 15:00:00 500 mg via INTRAVENOUS
  Filled 2017-05-27: qty 5

## 2017-05-27 MED ORDER — VENLAFAXINE HCL ER 37.5 MG PO CP24
37.5000 mg | ORAL_CAPSULE | Freq: Two times a day (BID) | ORAL | Status: DC
  Administered 2017-05-28: 37.5 mg via ORAL
  Filled 2017-05-27 (×3): qty 1

## 2017-05-27 MED ORDER — BUTALBITAL-APAP-CAFFEINE 50-325-40 MG PO TABS: 1.0000 | ORAL_TABLET | Freq: Four times a day (QID) | ORAL | Status: DC | PRN

## 2017-05-27 MED ORDER — SODIUM CHLORIDE 0.9 % IV SOLN
INTRAVENOUS | Status: DC
  Administered 2017-05-27: 20:00:00 via INTRAVENOUS

## 2017-05-27 MED ORDER — LEVOTHYROXINE SODIUM 88 MCG PO TABS
88.0000 ug | ORAL_TABLET | Freq: Every day | ORAL | Status: DC
  Filled 2017-05-27: qty 1

## 2017-05-27 NOTE — Progress Notes (Signed)
[place] at [place]: Debra Cantu    MR#:  [id number]  DATE OF BIRTH:  Feb 08, 1928  SUBJECTIVE:  CHIEF COMPLAINT:   Chief Complaint  Patient presents with  . Fall    came with subdural hematoma- after a fall. Was on coumadin. Noted some midline shift, had some worsening overnight but then started improving. More alert and answering simple questions now, complaining of some headache.  REVIEW OF SYSTEMS:  CONSTITUTIONAL: No fever, positive for fatigue or weakness.  EYES: No blurred or double vision.  EARS, NOSE, AND THROAT: No tinnitus or ear pain.  RESPIRATORY: No cough, shortness of breath, wheezing or hemoptysis.  CARDIOVASCULAR: No chest pain, orthopnea, edema.  GASTROINTESTINAL: No nausea, vomiting, diarrhea or abdominal pain.  GENITOURINARY: No dysuria, hematuria.  ENDOCRINE: No polyuria, nocturia,  HEMATOLOGY: No anemia, easy bruising or bleeding SKIN: No rash or lesion. MUSCULOSKELETAL: No joint pain or arthritis.   NEUROLOGIC: No tingling, numbness, have generalized weakness.  PSYCHIATRY: No anxiety or depression.   ROS  DRUG ALLERGIES:   Allergies  Allergen Reactions  . Oxycodone     Other reaction(s): Hallucination  . Gabapentin     Other reaction(s): Headache  . Rizatriptan     Other reaction(s): Other (See Comments) sedation  . Sulfa Antibiotics     Other reaction(s): Unknown    VITALS:  Blood pressure (!) 145/52, pulse (!) 55, temperature 98.2 F (36.8 C), temperature source Oral, resp. rate 20, height 4\' 11"  (1.499 m), weight 75.8 kg (167 lb), SpO2 100 %.  PHYSICAL EXAMINATION:  GENERAL:  83 y.o.-year-old patient lying in the bed with no acute distress.  EYES: Pupils equal, round, reactive to light and accommodation. No scleral icterus. Extraocular muscles intact.  HEENT: Head atraumatic, normocephalic. Oropharynx and nasopharynx clear.  NECK:  Supple, no jugular venous distention. No thyroid  enlargement, no tenderness.  LUNGS: Normal breath sounds bilaterally, no wheezing, rales,rhonchi or crepitation. No use of accessory muscles of respiration.  CARDIOVASCULAR: S1, S2 normal. No murmurs, rubs, or gallops.  ABDOMEN: Soft, nontender, nondistended. Bowel sounds present. No organomegaly or mass.  EXTREMITIES: No pedal edema, cyanosis, or clubbing.  NEUROLOGIC: Cranial nerves II through XII are intact. Muscle strength 4/5 in right side extremities, have 1/5 in left side extremities. Sensation intact. Gait not checked.  PSYCHIATRIC: The patient is alert and oriented x 2.  SKIN: No obvious rash, lesion, or ulcer.   Physical Exam LABORATORY PANEL:   CBC  Recent Labs Lab 05/25/17 1410  WBC 7.6  HGB 14.4  HCT 44.2  PLT 183   ------------------------------------------------------------------------------------------------------------------  Chemistries   Recent Labs Lab 05/25/17 1410  NA 138  K 4.5  CL 104  CO2 29  GLUCOSE 114*  BUN 13  CREATININE 0.68  CALCIUM 9.9  AST 27  ALT 26  ALKPHOS 84  BILITOT 0.5   ------------------------------------------------------------------------------------------------------------------  Cardiac Enzymes No results for input(s): TROPONINI in the last 168 hours. ------------------------------------------------------------------------------------------------------------------  RADIOLOGY:  Ct Head Wo Contrast  Result Date: 05/26/2017 CLINICAL DATA:  Subdural hematoma follow-up EXAM: CT HEAD WITHOUT CONTRAST TECHNIQUE: Contiguous axial images were obtained from the base of the skull through the vertex without intravenous contrast. COMPARISON:  Head CT 05/25/2017 at 3:22 p.m. Brain MRI 01/02/2011 FINDINGS: Brain: The component of the right hemispheric subdural hematoma overlying the right convexity is decreased in size and now measures 10 mm, previously 17 mm. Leftward midline shift has greatly improved, now measuring 4 mm.  Concentrated  blood at the right anterior aspect of the falx cerebri is unchanged. There is a small amount of subarachnoid blood along the right frontal and parietal convexities. There is also a small amount of left frontal subarachnoid blood. Densely calcified left convexity meningioma is unchanged. Previously seen herniation has resolved. Basal cisterns are patent. No hydrocephalus or ventricular trapping. Vascular: No hyperdense vessel or unexpected calcification. Skull: Right parieto-occipital scalp hematoma.  No skull fracture. Sinuses/Orbits: No sinus fluid levels or advanced mucosal thickening. No mastoid effusion. Normal orbits. IMPRESSION: 1. Decreased thickness of right convexity subdural hematoma, owing either to true decrease in size or redistribution of blood. The previously seen midline shift has greatly improved, now measuring just 4 mm, and the herniation has resolved. 2. Trace areas of subarachnoid blood overlying the right frontal, right parietal and left frontal lobes. This is a very small amount of blood. 3. Unchanged appearance of densely calcified left convexity extra-axial mass, consistent with meningioma. Electronically Signed   By: Ulyses Jarred M.D.   On: 05/26/2017 03:22    ASSESSMENT AND PLAN:   Active Problems:   Subdural hematoma (HCC)   1.  Subdural hematoma and subarachnoid hemorrhage from fall.   Repeat CT scan worsening with herniation.  Patient is a DNR.  Was admitted for comfort measures.     As needed morphine.  Empiric Keppra to prevent seizure.  As needed nausea medications.  Now have some improvement in her mental condition and slight decrease in the size of hematoma as per repeat CT scan. I had detailed discussion with patient's son in the room again this morning, they agreed for now no aggressive measures , but as she is improved, need to work on possible rehab and NH arrangements.  keep her DO NOT RESUSCITATE. SLP eval, PT and Neurology eval for further plan.   2.  History of pulmonary embolism on Coumadin.  ER physician did give vitamin K   Will not resume anticoagulation.  3.  History of essential hypertension.  Hold medications at this time.   BP is normal.  4.  History of hyperlipidemia.    Lipitor 5.  History of hypothyroidism.     Resume Synthroid at this time    spoke to  [place] at [phone]    All the records are reviewed and case discussed with Care Management/Social Workerr. Management plans discussed with the patient, family and they are in agreement.  CODE STATUS: DO NOT RESUSCITATE  TOTAL TIME TAKING CARE OF THIS PATIENT: 35  minutes.   POSSIBLE D/C IN 1-2  DAYS, DEPENDING ON CLINICAL CONDITION.   Vaughan Basta M.D on 05/27/2017   Between 7am to 6pm - Pager - [phone]  After 6pm go to [email or web address] - password [email or web address] [facility]  Office  [phone]  CC: Primary care physician; Adin Hector, MD  Note: This dictation was prepared with Dragon dictation along with smaller phrase technology. Any transcriptional errors that result from this process are unintentional.

## 2017-05-27 NOTE — Progress Notes (Signed)
Witnessed LLE clonic activity x2. Madlyn Frankel, RN

## 2017-05-27 NOTE — Consult Note (Signed)
Consultation Note Date: 05/27/2017   Patient Name: Debra Cantu  DOB: [birth year]  MRN: [id number]  Age / Sex: 83 y.o., female  PCP: Adin Hector, MD Referring Physician: Vaughan Basta, *  Reason for Consultation: Establishing goals of care  HPI/Patient Profile: Debra Cantu  is a 83 y.o. female presented after a fall.  The patient does take Coumadin at home.  Initial CAT scan did show an acute intracranial hemorrhage combination of anterior and interhemispheric subdural and adjacent subarachnoid hemorrhage.  A couple hours later the patient's mental status worsened and a repeat CT scan was ordered.  This showed interval development of a large right convexity subdural hematoma measuring 1.8 cm causing significant mass-effect upon the right lateral ventricle with 1.2 mm of midline shift to the left.  Right uncal herniation.  Clinical Assessment and Goals of Care: Debra Cantu is resting in bed. She is alert and oriented. She has left side weakness.  Her children are at bedside including her POA, who is her son Debra Monrreal MD. Debra Cantu lives in a retirement community. Her husband died about 6 years ago from dementia. Her children live across the [place]. She was independent prior to this fall and loved to visit friends and see had assistance, but went grocery shopping.   We discussed diagnosis, prognosis, GOC, EOL wishes disposition and options.  A detailed discussion was had today regarding advanced directives.  Concepts specific to code status, artifical feeding and hydration, and IV antibiotics.  The difference between an aggressive medical intervention path and a palliative comfort care path was discussed.  Values and goals of care important to patient and family were also discussed.  MOST form completed, signed by son who is POA as representative, but decisions by patient. Debra Cantu wishes to be DNR,  stating she is ready to go to heaven to be with her husband if she has been good enough to get there. She does not want to be placed on a ventilator.  She is okay with the current treatment.   She is noted to have a wet cough. Concerns for silent aspiration. Debra Cantu would not want a feeding tube placed, but discussed instrumented swallow evaluation to determine if modifications could be made to her diet,or strategies to lessen the chance of aspiration.       Questions and concerns addressed.  Family encouraged to call with questions or concerns.  PMT will continue to support holistically.  Will follow up tomorrow.    SUMMARY OF RECOMMENDATIONS   Recommend Continue current treatment.   Recommend swallow eval when able to determine safe oral intake and strategies which would help prevent coughing or aspiration.     Code Status/Advance Care Planning:  DNR  Palliative Prophylaxis:   Aspiration   Prognosis:   Unable to determine Depends on her progress over the next few days.   Discharge Planning: To Be Determined      Primary Diagnoses: Present on Admission: . Subdural hematoma ([place])   I have reviewed the medical  record, interviewed the patient and family, and examined the patient. The following aspects are pertinent.  Past Medical History:  Diagnosis Date  . Arthritis   . Enlarged heart   . Hypercholesteremia   . Hypertension   . Macular degeneration   . Osteoporosis   . Pulmonary embolism [facility])    Social History   Social History  . Marital status: Married    Spouse name: N/A  . Number of children: N/A  . Years of education: N/A   Social History Main Topics  . Smoking status: Former Smoker    Packs/day: 0.10    Years: 1.00    Types: Cigarettes    Quit date: 10/17/1954  . Smokeless tobacco: Never Used  . Alcohol use No  . Drug use: No  . Sexual activity: Not Asked   Other Topics Concern  . None   Social History Narrative  . None   Family History    Problem Relation Age of Onset  . Heart disease Brother   . Stroke Father   . Alzheimer's disease Mother   . Cancer Sister        skin   Scheduled Meds: . atorvastatin  40 mg Oral q1800  . [START ON 05/28/2017] levothyroxine  88 mcg Oral QAC breakfast  . metoCLOPramide (REGLAN) injection  10 mg Intravenous Once  . scopolamine  1 patch Transdermal Q72H  . venlafaxine XR  37.5 mg Oral BID WC   Continuous Infusions: . levETIRAcetam     PRN Meds:.acetaminophen **OR** acetaminophen, butalbital-acetaminophen-caffeine, morphine injection, ondansetron (ZOFRAN) IV Medications Prior to Admission:  Prior to Admission medications   Medication Sig Start Date End Date Taking? Authorizing Provider  amLODipine (NORVASC) 2.5 MG tablet Take 2.5 mg by mouth daily.   Yes [provider]  atorvastatin (LIPITOR) 80 MG tablet Take 40 mg by mouth daily.    Yes [provider]  beta carotene 15 MG capsule Take 15 mg by mouth daily.   Yes [provider]  Cholecalciferol 2000 units TABS Take 2,000 Units by mouth 2 (two) times daily.    Yes [provider]  glucosamine-chondroitin 500-400 MG tablet Take 1 tablet by mouth 2 (two) times daily.    Yes [provider]  levothyroxine (SYNTHROID, LEVOTHROID) 88 MCG tablet Take 88 mcg by mouth daily before breakfast.   Yes [provider]  Multiple Vitamins-Minerals (PRESERVISION AREDS 2 PO) Take 1 tablet by mouth 2 (two) times daily.   Yes [provider]  telmisartan (MICARDIS) 80 MG tablet Take 40 mg by mouth daily. 03/19/17  Yes [provider]  venlafaxine (EFFEXOR) 37.5 MG tablet Take 37.5 mg by mouth 2 (two) times daily with a meal.  06/30/16  Yes [provider]  warfarin (COUMADIN) 5 MG tablet Take 5 mg by mouth daily.    Yes [provider]   Allergies  Allergen Reactions  . Oxycodone     Other reaction(s): Hallucination  . Gabapentin     Other reaction(s): Headache   . Rizatriptan     Other reaction(s): Other (See Comments) sedation  . Sulfa Antibiotics     Other reaction(s): Unknown   Review of Systems  Neurological: Positive for headaches.    Physical Exam  Constitutional: No distress.  Pulmonary/Chest: Effort normal.  Neurological: She is alert.  Oriented  Skin: Skin is warm and dry.    Vital Signs: BP (!) 145/52 (BP Location: Right Arm)   Pulse (!) 55   Temp 98.2 F (  36.8 C) (Oral)   Resp 20   Ht 4\' 11"  (1.499 m)   Wt 75.8 kg (167 lb)   SpO2 100%   BMI 33.73 kg/m  Pain Assessment: PAINAD   Pain Score: Asleep   SpO2: SpO2: 100 % O2 Device:SpO2: 100 % O2 Flow Rate: .O2 Flow Rate (L/min): 2 L/min  IO: Intake/output summary:  Intake/Output Summary (Last 24 hours) at 05/27/17 1557 Last data filed at 05/27/17 1038  Gross per 24 hour  Intake              530 ml  Output                0 ml  Net              530 ml    LBM: Last BM Date: 05/26/17 Baseline Weight: Weight: 75.8 kg (167 lb) Most recent weight: Weight: 75.8 kg (167 lb)     Palliative Assessment/Data:      Time In: 3:10 Time Out: 4:25 Time Total: 1 hour 15 minutes Greater than 50%  of this time was spent counseling and coordinating care related to the above assessment and plan.  Signed by: Asencion Gowda, NP 05/27/2017 4:24 PM Office: [phone] 7am-7pm  Pager: [phone] Call primary team after hours   Please contact Palliative Medicine Team phone at [phone] for questions and concerns.  For individual provider: See Shea Evans

## 2017-05-27 NOTE — Care Management Important Message (Signed)
Important Message  Patient Details  Name: Debra Cantu MRN: [id number] Date of Birth: 06/07/1928   Medicare Important Message Given:  Yes    Shelbie Ammons, RN 05/27/2017, 8:00 AM

## 2017-05-27 NOTE — Progress Notes (Signed)
Medications administered by student RN [id number] with supervision of Clinical Instructor Yesica Kemler MSN, RN-BC or patient's assigned RN.   

## 2017-05-27 NOTE — Progress Notes (Signed)
SLP Cancellation Note  Patient Details Name: Debra Cantu MRN: [id number] DOB: June 30, 1928   Cancelled treatment:       Reason Eval/Treat Not Completed: Medical issues which prohibited therapy (chart reviewed; consulted w/ Palliative Care and NSG)  Pt admitted s/p fall w/ right SDH with subarachnoid component, midline shift noted. Pt has shown more alertness, improvement. BSE performed yesterday. Noted Neurologist's note today re: pt's presentation. Due to pt's current status and an increase risk for dysphagia, po diet canceled; MBSS ordered. As pt has continued to show improvement, family may seek Rehab options for pt. ST services will f/u tomorrow w/ MBSS as long as pt is not having seizure activity which would prevent pt from going off floor for the MBSS (safety concerns).    Orinda Kenner, Advance, CCC-SLP Inmer Nix 05/27/2017, 4:07 PM

## 2017-05-27 NOTE — Consult Note (Signed)
Reason for Consult:SDH Referring Physician: Anselm Jungling  CC: LLE weakness  HPI: Debra Cantu is an 83 y.o. female who presented after a fall at home on Coumadin.  Was found to have right SDH with subarachnoid component.  Midline shift noted.  It was felt that prognosis was poor but patient improved.  Now with LLE weakness.  Consult called for further recommendations.  Family does not wish surgical intervention for hemorrhage.    Past Medical History:  Diagnosis Date  . Arthritis   . Enlarged heart   . Hypercholesteremia   . Hypertension   . Macular degeneration   . Osteoporosis   . Pulmonary embolism [facility])     Past Surgical History:  Procedure Laterality Date  . CATARACT EXTRACTION  2013  . CHOLECYSTECTOMY  2003  . HERNIA REPAIR  2004  . reconstructive foot surgery  2002  . REPLACEMENT TOTAL KNEE Right   . ruptured disc repair     T4, T5  . VAGINAL HYSTERECTOMY  1978  . VEIN LIGATION Bilateral     Family History  Problem Relation Age of Onset  . Heart disease Brother   . Stroke Father   . Alzheimer's disease Mother   . Cancer Sister        skin    Social History:  reports that she quit smoking about 62 years ago. Her smoking use included Cigarettes. She has a 0.10 pack-year smoking history. She has never used smokeless tobacco. She reports that she does not drink alcohol or use drugs.  Allergies  Allergen Reactions  . Oxycodone     Other reaction(s): Hallucination  . Gabapentin     Other reaction(s): Headache  . Rizatriptan     Other reaction(s): Other (See Comments) sedation  . Sulfa Antibiotics     Other reaction(s): Unknown    Medications:  I have reviewed the patient's current medications. Prior to Admission:  Prescriptions Prior to Admission  Medication Sig Dispense Refill Last Dose  . amLODipine (NORVASC) 2.5 MG tablet Take 2.5 mg by mouth daily.   05/24/2017 at 2000  . atorvastatin (LIPITOR) 80 MG tablet Take 40 mg by mouth daily.    05/24/2017 at  2000  . beta carotene 15 MG capsule Take 15 mg by mouth daily.   05/25/2017 at 0800  . Cholecalciferol 2000 units TABS Take 2,000 Units by mouth 2 (two) times daily.    05/25/2017 at 0800  . glucosamine-chondroitin 500-400 MG tablet Take 1 tablet by mouth 2 (two) times daily.    05/25/2017 at 0800  . levothyroxine (SYNTHROID, LEVOTHROID) 88 MCG tablet Take 88 mcg by mouth daily before breakfast.   05/25/2017 at 0700  . Multiple Vitamins-Minerals (PRESERVISION AREDS 2 PO) Take 1 tablet by mouth 2 (two) times daily.   05/25/2017 at 0800  . telmisartan (MICARDIS) 80 MG tablet Take 40 mg by mouth daily.  11 05/25/2017 at 0800  . venlafaxine (EFFEXOR) 37.5 MG tablet Take 37.5 mg by mouth 2 (two) times daily with a meal.    05/25/2017 at 0800  . warfarin (COUMADIN) 5 MG tablet Take 5 mg by mouth daily.    05/24/2017 at 2000   Scheduled: . atorvastatin  40 mg Oral q1800  . [START ON 05/28/2017] levothyroxine  88 mcg Oral QAC breakfast  . metoCLOPramide (REGLAN) injection  10 mg Intravenous Once  . scopolamine  1 patch Transdermal Q72H  . venlafaxine XR  37.5 mg Oral BID WC    ROS: Unable to provide due to  mental status  Physical Examination: Blood pressure (!) 145/52, pulse (!) 55, temperature 98.2 F (36.8 C), temperature source Oral, resp. rate 20, height 4\' 11"  (1.499 m), weight 75.8 kg (167 lb), SpO2 100 %.  HEENT-  Normocephalic, no lesions, without obvious abnormality.  Normal external eye and conjunctiva.  Normal TM's bilaterally.  Normal auditory canals and external ears. Normal external nose, mucus membranes and septum.  Normal pharynx. Cardiovascular- S1, S2 normal, pulses palpable throughout   Lungs- chest clear, no wheezing, rales, normal symmetric air entry Abdomen- soft, non-tender; bowel sounds normal; no masses,  no organomegaly Extremities- no edema Lymph-no adenopathy palpable Musculoskeletal-no joint tenderness, deformity or swelling Skin-warm and dry, no hyperpigmentation,  vitiligo, or suspicious lesions  Neurological Examination   Mental Status: Alert initially but with onset of clonic activity in the LLE the patient became lethargic and poorly responsive.  Speech initially fluent but patient became poorly responsive and unable to follow commands reproducibly at that time.  Cranial Nerves: II: Discs flat bilaterally; Blinks to bilateral confrontation III,IV, VI: ptosis not present, extra-ocular motions intact bilaterally V,VII: mild decrease in the left NLF, facial light touch sensation normal bilaterally VIII: hearing normal bilaterally IX,X: gag reflex present XI: bilateral shoulder shrug XII: midline tongue extension Motor: Right : Upper extremity   5/5    Left:     Upper extremity   2-3/5  Lower extremity   5/5     Lower extremity   0/5 Tone and bulk:normal tone throughout; no atrophy noted Sensory: Pinprick and light touch intact throughout, bilaterally Deep Tendon Reflexes: 2+ and symmetric throughout Plantars: Right: upgoing   Left: mute Cerebellar: Unable to test due to mental status Gait: not tested due to safety concerns   Laboratory Studies:   Basic Metabolic Panel:  Recent Labs Lab 05/25/17 1410  NA 138  K 4.5  CL 104  CO2 29  GLUCOSE 114*  BUN 13  CREATININE 0.68  CALCIUM 9.9    Liver Function Tests:  Recent Labs Lab 05/25/17 1410  AST 27  ALT 26  ALKPHOS 84  BILITOT 0.5  PROT 6.3*  ALBUMIN 3.8   No results for input(s): LIPASE, AMYLASE in the last 168 hours. No results for input(s): AMMONIA in the last 168 hours.  CBC:  Recent Labs Lab 05/25/17 1410  WBC 7.6  NEUTROABS 5.6  HGB 14.4  HCT 44.2  MCV 91.0  PLT 183    Cardiac Enzymes: No results for input(s): CKTOTAL, CKMB, CKMBINDEX, TROPONINI in the last 168 hours.  BNP: Invalid input(s): POCBNP  CBG: No results for input(s): GLUCAP in the last 168 hours.  Microbiology: No results found for this or any previous visit.  Coagulation  Studies:  Recent Labs  05/25/17 1410 05/26/17 0110  LABPROT 22.0* 15.9*  INR 1.94 1.28    Urinalysis: No results for input(s): COLORURINE, LABSPEC, PHURINE, GLUCOSEU, HGBUR, BILIRUBINUR, KETONESUR, PROTEINUR, UROBILINOGEN, NITRITE, LEUKOCYTESUR in the last 168 hours.  Invalid input(s): APPERANCEUR  Lipid Panel:  No results found for: CHOL, TRIG, HDL, CHOLHDL, VLDL, LDLCALC  HgbA1C: No results found for: HGBA1C  Urine Drug Screen:  No results found for: LABOPIA, COCAINSCRNUR, LABBENZ, AMPHETMU, THCU, LABBARB  Alcohol Level: No results for input(s): ETH in the last 168 hours.   Imaging: Ct Head Wo Contrast  Result Date: 05/26/2017 CLINICAL DATA:  Subdural hematoma follow-up EXAM: CT HEAD WITHOUT CONTRAST TECHNIQUE: Contiguous axial images were obtained from the base of the skull through the vertex without intravenous contrast. COMPARISON:  Head CT 05/25/2017 at 3:22 p.m. Brain MRI 01/02/2011 FINDINGS: Brain: The component of the right hemispheric subdural hematoma overlying the right convexity is decreased in size and now measures 10 mm, previously 17 mm. Leftward midline shift has greatly improved, now measuring 4 mm. Concentrated blood at the right anterior aspect of the falx cerebri is unchanged. There is a small amount of subarachnoid blood along the right frontal and parietal convexities. There is also a small amount of left frontal subarachnoid blood. Densely calcified left convexity meningioma is unchanged. Previously seen herniation has resolved. Basal cisterns are patent. No hydrocephalus or ventricular trapping. Vascular: No hyperdense vessel or unexpected calcification. Skull: Right parieto-occipital scalp hematoma.  No skull fracture. Sinuses/Orbits: No sinus fluid levels or advanced mucosal thickening. No mastoid effusion. Normal orbits. IMPRESSION: 1. Decreased thickness of right convexity subdural hematoma, owing either to true decrease in size or redistribution of blood. The  previously seen midline shift has greatly improved, now measuring just 4 mm, and the herniation has resolved. 2. Trace areas of subarachnoid blood overlying the right frontal, right parietal and left frontal lobes. This is a very small amount of blood. 3. Unchanged appearance of densely calcified left convexity extra-axial mass, consistent with meningioma. Electronically Signed   By: Ulyses Jarred M.D.   On: 05/26/2017 03:22   Ct Head Wo Contrast  Result Date: 05/25/2017 CLINICAL DATA:  83 year old female with acute mental status changes. Subsequent encounter. EXAM: CT HEAD WITHOUT CONTRAST TECHNIQUE: Contiguous axial images were obtained from the base of the skull through the vertex without intravenous contrast. COMPARISON:  05/25/2017 1:39 p.m. CT. FINDINGS: Brain: Interval development of large right convexity subdural hematoma measuring up to 1.8 cm causing significant mass effect upon the right lateral ventricle with 1.2 mm of midline shift to the left with early trapping of the left lateral ventricle. Right uncal herniation. Enlarging right parafalcine subdural hematoma now measuring 1.1 cm maximal thickness versus prior 0.8 cm. Right frontal lobe hemorrhagic contusion. Frontal region bilateral subarachnoid blood. Left frontal-temporal extra-axial 5.6 x 2.4 x 4.2 cm mass suggestive of a meningioma with local mass effect unchanged. Vascular: Vascular calcifications. Skull: No skull fracture. Sinuses/Orbits: No acute orbital abnormality. Visualized paranasal sinuses are clear. Other: Enlarging right parietal subcutaneous hematoma. IMPRESSION: Interval development of large right convexity subdural hematoma measuring up to 1.8 cm causing significant mass effect upon the right lateral ventricle with 1.2 mm of midline shift to the left with early trapping of the left lateral ventricle. Right uncal herniation. Enlarging right parafalcine subdural hematoma now measuring 1.1 cm maximal thickness versus prior 0.8  cm. Right frontal lobe hemorrhagic contusion. Frontal region bilateral subarachnoid blood. Left frontal-temporal extra-axial 5.6 x 2.4 x 4.2 cm mass suggestive of a meningioma with local mass effect unchanged. Enlarging right parietal subcutaneous hematoma. No underlying skull fracture. These results were called by telephone at the time of interpretation on 05/25/2017 at 3:28 pm to Dr. Lenise Arena , who verbally acknowledged these results. Electronically Signed   By: Genia Del M.D.   On: 05/25/2017 15:48     Assessment/Plan: 83 year old female presenting after fall with SDH and associated subarachnoid component.  Initial head CT and rpeat head CT reviewed.  Coagulopathy reversed. Patient has made some progress clinically.  Noted on neurological examination today to have some LLE clonic activity and after further conversation with family this appears to have frequently been the case for the past 1-2 days.  Was felt to be clonus.  This may  be contributing to the left sided weakness but may also be related to the layering of blood.  Can not rule out worsening of hemorrhage but since family not interested in surgical intervention at this time, there is no need for further imaging.  Patient on Keppra.    Recommendations: 1.  Keppra 500mg  IV now 2.  Increase maintenance Keppra to 1000mg  IV q 12 hours 3.  Seizure precautions 4.  May use Ativan prn seizure activity  Alexis Goodell, MD Neurology [phone] 05/27/2017, 2:46 PM

## 2017-05-28 ENCOUNTER — Inpatient Hospital Stay: Payer: Medicare Other

## 2017-05-28 MED ORDER — LEVETIRACETAM 1000 MG PO TABS: 1000.0000 mg | ORAL_TABLET | Freq: Two times a day (BID) | ORAL | 0 refills | Status: AC

## 2017-05-28 MED ORDER — BUTALBITAL-APAP-CAFFEINE 50-325-40 MG PO TABS: 1.0000 | ORAL_TABLET | Freq: Four times a day (QID) | ORAL | 0 refills | Status: AC | PRN

## 2017-05-28 MED ORDER — AMLODIPINE BESYLATE 5 MG PO TABS
2.5000 mg | ORAL_TABLET | Freq: Every day | ORAL | Status: DC
  Administered 2017-05-28: 12:00:00 2.5 mg via ORAL
  Filled 2017-05-28: qty 1

## 2017-05-28 MED ORDER — LEVETIRACETAM 500 MG PO TABS
1000.0000 mg | ORAL_TABLET | Freq: Two times a day (BID) | ORAL | Status: DC
  Administered 2017-05-28: 12:00:00 1000 mg via ORAL
  Filled 2017-05-28 (×2): qty 2

## 2017-05-28 NOTE — Clinical Social Work Placement (Signed)
   CLINICAL SOCIAL WORK PLACEMENT  NOTE  Date:  05/28/2017  Patient Details  Name: Debra Cantu MRN: [id number] Date of Birth: 08/29/1927  Clinical Social Work is seeking post-discharge placement for this patient at the [place] level of care (*CSW will initial, date and re-position this form in  chart as items are completed):  Yes   Patient/family provided with [place] Work Department's list of facilities offering this level of care within the geographic area requested by the patient (or if unable, by the patient's family).  Yes   Patient/family informed of their freedom to choose among providers that offer the needed level of care, that participate in Medicare, Medicaid or managed care program needed by the patient, have an available bed and are willing to accept the patient.  Yes   Patient/family informed of [facility]'s ownership interest in [facility] and [facility], as well as of the fact that they are under no obligation to receive care at these facilities.  PASRR submitted to EDS on 05/28/17     PASRR number received on       Existing PASRR number confirmed on 05/28/17     FL2 transmitted to all facilities in geographic area requested by pt/family on 05/28/17     FL2 transmitted to all facilities within larger geographic area on       Patient informed that his/her managed care company has contracts with or will negotiate with certain facilities, including the following:        Yes   Patient/family informed of bed offers received.  Patient chooses bed at [facility]     Physician recommends and patient chooses bed at      Patient to be transferred to [facility] on 05/28/17.  Patient to be transferred to facility by [facility]     Patient family notified on 05/28/17 of transfer.  Name of family member notified:  Patient's son Gaylia Kassel     PHYSICIAN Please sign FL2, Please sign DNR     Additional Comment:     _______________________________________________ Ross Ludwig, LCSWA 05/28/2017, 7:39 PM

## 2017-05-28 NOTE — NC FL2 (Signed)
[place] LEVEL OF CARE SCREENING TOOL     IDENTIFICATION  Patient Name: Debra Cantu Birthdate: [birth year] Sex: female Admission Date (Current Location): 05/25/2017  [place] and [place] Number:  Engineering geologist and Address:  [facility], [address], [place], [place] [address]      Provider Number: [id number]  Attending Physician Name and Address:  Vaughan Basta, *  Relative Name and Phone Number:       Current Level of Care: Hospital Recommended Level of Care: [place] Prior Approval Number:    Date Approved/Denied:   PASRR Number:  ([id number] )  Discharge Plan: SNF    Current Diagnoses: Patient Active Problem List   Diagnosis Date Noted  . Head pain 05/27/2017  . Subdural hematoma ([place]) 05/25/2017  . Costochondritis 07/03/2015  . Chronic diastolic heart failure ([place]) 03/18/2015  . URI (upper respiratory infection) 12/07/2014  . Pulmonary hypertension ([place]) 10/17/2014  . Acute pulmonary embolism ([place]) 10/17/2014  . Obstructive sleep apnea 10/17/2014    Orientation RESPIRATION BLADDER Height & Weight     Self, Place  Normal Incontinent Weight: 167 lb (75.8 kg) Height:  4\' 11"  (149.9 cm)  BEHAVIORAL SYMPTOMS/MOOD NEUROLOGICAL BOWEL NUTRITION STATUS      Continent Diet (Dysphagia 3 (Mech soft) solids;Thin liquid)  AMBULATORY STATUS COMMUNICATION OF NEEDS Skin   Extensive Assist Verbally Normal                       Personal Care Assistance Level of Assistance  Bathing, Feeding, Dressing Bathing Assistance: Limited assistance Feeding assistance: Independent Dressing Assistance: Limited assistance     Functional Limitations Info  Sight, Hearing, Speech Sight Info: Impaired Hearing Info: Adequate Speech Info: Adequate    SPECIAL CARE FACTORS FREQUENCY  PT (By licensed PT), OT (By licensed OT)     PT Frequency:  (5) OT Frequency:  (5)            Contractures       Additional Factors Info  Code Status, Allergies Code Status Info:  (DNR ) Allergies Info:  (Oxycodone, Gabapentin, Rizatriptan, Sulfa Antibiotics)           Current Medications (05/28/2017):  This is the current hospital active medication list Current Facility-Administered Medications  Medication Dose Route Frequency Provider Last Rate Last Dose  . 0.9 %  sodium chloride infusion   Intravenous Continuous Saundra Shelling, MD 50 mL/hr at 05/27/17 1945    . acetaminophen (TYLENOL) tablet 650 mg  650 mg Oral Q6H PRN Loletha Grayer, MD   650 mg at 05/27/17 1121   Or  . acetaminophen (TYLENOL) suppository 650 mg  650 mg Rectal Q6H PRN Loletha Grayer, MD      . amLODipine (NORVASC) tablet 2.5 mg  2.5 mg Oral Daily Vaughan Basta, MD   2.5 mg at 05/28/17 1227  . atorvastatin (LIPITOR) tablet 40 mg  40 mg Oral q1800 Vaughan Basta, MD      . butalbital-acetaminophen-caffeine (FIORICET, ESGIC) [phone] MG per tablet 1 tablet  1 tablet Oral Q6H PRN Vaughan Basta, MD   1 tablet at 05/28/17 1227  . levETIRAcetam (KEPPRA) tablet 1,000 mg  1,000 mg Oral BID Alexis Goodell, MD   1,000 mg at 05/28/17 1227  . levothyroxine (SYNTHROID, LEVOTHROID) tablet 88 mcg  88 mcg Oral QAC breakfast Vaughan Basta, MD      . metoCLOPramide (REGLAN) injection 10 mg  10 mg Intravenous Once [name], MD      .  morphine 2 MG/ML injection 2 mg  2 mg Intravenous Q1H PRN Loletha Grayer, MD   2 mg at 05/28/17 0223  . ondansetron (ZOFRAN) injection 4 mg  4 mg Intravenous Q6H PRN Loletha Grayer, MD   4 mg at 05/26/17 2337  . scopolamine (TRANSDERM-SCOP) 1 MG/3DAYS 1.5 mg  1 patch Transdermal Q72H Loletha Grayer, MD   1.5 mg at 05/25/17 2010  . venlafaxine XR (EFFEXOR-XR) 24 hr capsule 37.5 mg  37.5 mg Oral BID WC Vaughan Basta, MD   37.5 mg at 05/28/17 1227     Discharge Medications: Please see discharge summary for a list of discharge  medications.  Relevant Imaging Results:  Relevant Lab Results:   Additional Information  (SSN: [id number])  Sample, Veronia Beets, LCSW

## 2017-05-28 NOTE — Evaluation (Signed)
Physical Therapy Evaluation Patient Details Name: Debra Cantu MRN: [id number] DOB: [birth year] Today's Date: 05/28/2017   History of Present Illness  Pt is an 83 y.o. female presenting s/p fall (takes Coumadin at home); c/o HA and sacral pain.  No pelvic fx noted on imaging (positive for severe DJD R SI joint).  Initial CAT scan did show an acute intracranial hemorrhage combination of anterior and interhemispheric subdural and adjacent subarachnoid hemorrhage.  A couple hours later the patient's mental status worsened and a repeat CT scan was ordered.  This showed interval development of a large right convexity subdural hematoma measuring 1.8 cm causing significant mass-effect upon the right lateral ventricle with 1.2 mm of midline shift to the left.  Right uncal herniation.  Repeat imaging showing midline shift improved and uncal herniation resolved.  L sided weakness noted initially but per notes appears to be improving.  PMH includes htn, HOH, PE, diastolic heart failure, OSA, costochondritis, R TKA, macular degeneration.  Clinical Impression  Prior to hospital admission, pt was independent (no AD in home but used [employer] in community for ambulation).  Pt lives at [facility].  Upon entry, pt appearing lethargic in bed and unable to stay awake to perform UE and LE neuro assessment well.  Once pt sat up on edge of bed pt consistently awake and participated well with therapy.  Currently pt is mod assist with bed mobility, min to mod assist to stand with R UE support, and mod assist to take one step forward and backwards with L knee blocked and R UE supported.  Pt fatigued quickly requiring rest breaks but appeared motivated to participate.  BP 156/67 resting in bed beginning of session; 164/82 sitting edge of bed; 181/70 after returning to bed (HOB elevated); and 160/68 after a few minutes rest in bed (nursing notified of pt's vitals during session).  Pt would benefit from skilled PT to  address noted impairments and functional limitations (see below for any additional details).  Upon hospital discharge, recommend pt discharge to [facility].    Follow Up Recommendations SNF    Equipment Recommendations  Rolling walker with 5" wheels    Recommendations for Other Services OT consult     Precautions / Restrictions Precautions Precautions: Fall Restrictions Weight Bearing Restrictions: No      Mobility  Bed Mobility Overal bed mobility: Needs Assistance Bed Mobility: Supine to Sit;Sit to Supine     Supine to sit: Mod assist Sit to supine: Mod assist   General bed mobility comments: pt able to initiate to move B LE's towards edge of bed (minimal assist of therapist) and mod assist for trunk supine to sit; assist for trunk and B LE's sit to supine; vc's for technique  Transfers Overall transfer level: Needs assistance Equipment used:  (R UE holding onto bed rail) Transfers: Sit to/from Stand Sit to Stand: Mod assist;Min assist         General transfer comment: mod assist to stand 1st trial and min assist 2nd trial; L knee blocked (min assist) d/t weakness; limited time standing d/t weakness/fatigue; vc's for technique required; mod assist to control descent sitting  Ambulation/Gait Ambulation/Gait assistance: Mod assist   Assistive device:  (R UE holding onto bed rail)   Gait velocity: decreased   General Gait Details: L knee blocked (min assist) to take one small step forward and backwards with R LE (minimal step height and clearance); able to take step forward/backward with L LE; min to mod assist to  steady pt with ambulation trials  Stairs            Wheelchair Mobility    Modified Rankin (Stroke Patients Only)       Balance Overall balance assessment: Needs assistance Sitting-balance support: Bilateral upper extremity supported;Feet supported Sitting balance-Leahy Scale: Poor Sitting balance - Comments: close SBA to min to mod assist for  sitting balance (pt intermittently leaning to L and backwards requiring assist to reposition in midline) Postural control: Posterior lean;Left lateral lean Standing balance support: Single extremity supported (on R side rail) Standing balance-Leahy Scale: Poor Standing balance comment: pt with L lean (with L knee blocked) standing requiring vc's to shift weight to R towards midline (pt able to respond to cueing to perform and min assist of therapist to maintain position in midline)                             Pertinent Vitals/Pain Pain Assessment: 0-10 Pain Score: 6  (8/10 beginning of session; 6/10 end of session) Pain Location: headache Pain Descriptors / Indicators: Headache Pain Intervention(s): Limited activity within patient's tolerance;Monitored during session;Premedicated before session;Repositioned    Home Living Family/patient expects to be discharged to:: Skilled nursing facility                 Additional Comments: Pt lives at [facility].  Has SPC and RW in home.  Wears B hearing aides.    Prior Function Level of Independence: Independent with assistive device(s)         Comments: Pt does not use AD in home; uses SPC in community.  Pt's son reports a couple of falls in past 6 months (only fall with injury was recent fall).     Hand Dominance        Extremity/Trunk Assessment   Upper Extremity Assessment Upper Extremity Assessment:  (at least 3+/5 B shoulder flexion, elbow flexion/extension; good B hand grip; difficult to assess overall d/t lethargy)    Lower Extremity Assessment Lower Extremity Assessment:  (at least 3+/5 B hip flexion, knee flexion/extension, and DF; difficult to assess d/t lethargy)    Cervical / Trunk Assessment Cervical / Trunk Assessment: Normal  Communication   Communication: HOH  Cognition Arousal/Alertness: Lethargic Behavior During Therapy: WFL for tasks assessed/performed Overall Cognitive  Status:  (Oriented to person and place.)                                        General Comments General comments (skin integrity, edema, etc.): Pt's son present during session.  Nursing cleared pt for participation in physical therapy.  Pt and pt's son agreeable to PT session.    Exercises  Transfer training.   Assessment/Plan    PT Assessment Patient needs continued PT services  PT Problem List Decreased strength;Decreased activity tolerance;Decreased balance;Decreased mobility;Decreased coordination;Decreased cognition;Decreased knowledge of use of DME;Decreased knowledge of precautions;Pain       PT Treatment Interventions DME instruction;Gait training;Functional mobility training;Therapeutic activities;Therapeutic exercise;Balance training;Neuromuscular re-education;Patient/family education    PT Goals (Current goals can be found in the Care Plan section)  Acute Rehab PT Goals Patient Stated Goal: to be able to walk again PT Goal Formulation: With family Time For Goal Achievement: 06/11/17 Potential to Achieve Goals: Fair    Frequency 7X/week   Barriers to discharge Decreased caregiver support  Co-evaluation               AM-PAC PT "6 Clicks" Daily Activity  Outcome Measure Difficulty turning over in bed (including adjusting bedclothes, sheets and blankets)?: Unable Difficulty moving from lying on back to sitting on the side of the bed? : Unable Difficulty sitting down on and standing up from a chair with arms (e.g., wheelchair, bedside commode, etc,.)?: Unable Help needed moving to and from a bed to chair (including a wheelchair)?: A Lot Help needed walking in hospital room?: Total Help needed climbing 3-5 steps with a railing? : Total 6 Click Score: 7    End of Session Equipment Utilized During Treatment: Gait belt Activity Tolerance: Patient limited by fatigue Patient left: in bed;with call bell/phone within reach;with bed alarm  set;with family/visitor present Nurse Communication: Mobility status;Precautions (Pt's vitals (including BP) during session.) PT Visit Diagnosis: Other abnormalities of gait and mobility (R26.89);Muscle weakness (generalized) (M62.81);History of falling (Z91.81);Hemiplegia and hemiparesis Hemiplegia - Right/Left: Left Hemiplegia - caused by: Unspecified    Time: 1415-1450 PT Time Calculation (min) (ACUTE ONLY): 35 min   Charges:   PT Evaluation $PT Eval Low Complexity: 1 Low PT Treatments $Therapeutic Activity: 8-22 mins   PT G Codes:   PT G-Codes **NOT FOR INPATIENT CLASS** Functional Assessment Tool Used: AM-PAC 6 Clicks Basic Mobility Functional Limitation: Mobility: Walking and moving around Mobility: Walking and Moving Around Current Status (G6269): At least 80 percent but less than 100 percent impaired, limited or restricted Mobility: Walking and Moving Around Goal Status [phone]): At least 40 percent but less than 60 percent impaired, limited or restricted    Leitha Bleak, PT 05/28/17, 3:39 PM [phone]

## 2017-05-28 NOTE — Evaluation (Signed)
Objective Swallowing Evaluation: Type of Study: MBS-Modified Barium Swallow Study  Patient Details  Name: Debra Cantu MRN: [id number] Date of Birth: 30-Aug-1927  Today's Date: 05/28/2017 Time: SLP Start Time (ACUTE ONLY): 1110-SLP Stop Time (ACUTE ONLY): 1210 SLP Time Calculation (min) (ACUTE ONLY): 60 min  Past Medical History:  Past Medical History:  Diagnosis Date  . Arthritis   . Enlarged heart   . Hypercholesteremia   . Hypertension   . Macular degeneration   . Osteoporosis   . Pulmonary embolism [facility])    Past Surgical History:  Past Surgical History:  Procedure Laterality Date  . CATARACT EXTRACTION  2013  . CHOLECYSTECTOMY  2003  . HERNIA REPAIR  2004  . reconstructive foot surgery  2002  . REPLACEMENT TOTAL KNEE Right   . ruptured disc repair     T4, T5  . VAGINAL HYSTERECTOMY  1978  . VEIN LIGATION Bilateral    HPI: Pt is a 83 y.o. female w/ PMH of enlarged heart, HTN, PE and arthritis who presented after a fall.  The patient does take Coumadin at home.  Initial CAT scan did show an acute intracranial hemorrhage combination of anterior and interhemispheric subdural and adjacent subarachnoid hemorrhage.  A couple hours later the patient's mental status worsened and a repeat CT scan was ordered.  This showed interval development of a large right convexity subdural hematoma measuring 1.8 cm causing significant mass-effect upon the right lateral ventricle with 1.2 mm of midline shift to the left.  Right uncal herniation.  ER physician Dr. Jimmye Norman called the patient's son Richardson Landry who decided to honor the patient's wishes of a DO NOT RESUSCITATE.  Patient was admitted here without neurosurgical intervention.  Pt has improved neurologically since admission and is now more alert/awake and verbally conversive.  Pt and family wanted swallowing assessed.  Pt passed a BSE and was initially on a Dysphagia level 1 w/ thin liquids diet but there was concern about pt having a cough so a MBSS  was ordered as f/u.   Subjective: pt awake, verbally conversive but easily fatigued w/ the exertion of coming for the exam   Assessment / Plan / Recommendation  CHL IP CLINICAL IMPRESSIONS 05/28/2017  Clinical Impression Pt appeared to present w/ adequate oropharyngeal phase swallow function w/ all trial consistencies assessed at this exam today. Pt is at decreased risk for aspiration w/ oral intake when following general aspiration precautions and taking rest breaks when eating/drinking if too fatigued d/t Neurological status. Pt's level of alertness and mental status are impacted by the recent right SDH with subarachnoid component s/p fall at home and the introduction of pain medications(if causing drowsiness); this can also impact overall swallowing and safety of swallowing. Pt was presented w/ trials of thin liquids, purees and soft solids w/ min setup support. Pt consumed po trials w/ NO oral phase dysphagia/deficits noted - appropriate and timely bolus control and management followed by oral clearing. During the pharyngeal phase of swallowing, pt exhibited timely pharyngeal swallow initiation w/ all consistencies w/ NO laryngeal penetration or aspiration noted to occur; appropriate airway protection noted. Any oropharyngeal residue was minimal and noted to clear w/ an independent, f/u dry swallow indicating adequate pharyngeal pressure and laryngeal excursion during the swallow. Pt helped to feed self w/ setup support. Pt was given min rest breaks b/t trials - pt was not rushed.   SLP Visit Diagnosis Dysphagia, oropharyngeal phase (R13.12)  Attention and concentration deficit following --  Frontal lobe and executive function  deficit following --  Impact on safety and function (No Data)        CHL IP TREATMENT RECOMMENDATION 05/28/2017  Treatment Recommendations No treatment recommended at this time     Prognosis 05/28/2017  Prognosis for Safe Diet Advancement Good  Barriers to Reach Goals (No  Data)  Barriers/Prognosis Comment --    CHL IP DIET RECOMMENDATION 05/28/2017  SLP Diet Recommendations Dysphagia 3 (Mech soft) solids;Thin liquid  Liquid Administration via Cup;No straw  Medication Administration Whole meds with puree  Compensations Minimize environmental distractions;Slow rate;Small sips/bites;Lingual sweep for clearance of pocketing;Multiple dry swallows after each bite/sip;Follow solids with liquid  Postural Changes Remain semi-upright after after feeds/meals (Comment);Seated upright at 90 degrees      CHL IP OTHER RECOMMENDATIONS 05/28/2017  Recommended Consults (No Data)  Oral Care Recommendations Oral care BID;Patient independent with oral care;Staff/trained caregiver to provide oral care  Other Recommendations (No Data)      CHL IP FOLLOW UP RECOMMENDATIONS 05/28/2017  Follow up Recommendations None      CHL IP FREQUENCY AND DURATION 05/28/2017  Speech Therapy Frequency (ACUTE ONLY) (No Data)  Treatment Duration (No Data)          Objective:  Radiological Procedure: A videoflouroscopic evaluation of oral-preparatory, reflex initiation, and pharyngeal phases of the swallow was performed; as well as a screening of the upper esophageal phase.  I. POSTURE: upright  II. VIEW: lateral  III. COMPENSATORY STRATEGIES: f/u, dry swallow intermittently IV. BOLUSES ADMINISTERED:  Thin Liquid: 6 trials  Nectar-thick Liquid: 1 trial  Honey-thick Liquid: NT  Puree: 4 trials  Mechanical Soft: 2 trials    CHL IP ORAL PHASE 05/28/2017  Oral Phase WFL  Oral - Pudding Teaspoon --  Oral - Pudding Cup --  Oral - Honey Teaspoon --  Oral - Honey Cup --  Oral - Nectar Teaspoon --  Oral - Nectar Cup --  Oral - Nectar Straw --  Oral - Thin Teaspoon --  Oral - Thin Cup --  Oral - Thin Straw --  Oral - Puree --  Oral - Mech Soft --  Oral - Regular --  Oral - Multi-Consistency --  Oral - Pill --  Oral Phase - Comment --    CHL IP PHARYNGEAL PHASE 05/28/2017   Pharyngeal Phase WFL  Pharyngeal- Pudding Teaspoon --  Pharyngeal --  Pharyngeal- Pudding Cup --  Pharyngeal --  Pharyngeal- Honey Teaspoon --  Pharyngeal --  Pharyngeal- Honey Cup --  Pharyngeal --  Pharyngeal- Nectar Teaspoon --  Pharyngeal --  Pharyngeal- Nectar Cup --  Pharyngeal --  Pharyngeal- Nectar Straw --  Pharyngeal --  Pharyngeal- Thin Teaspoon --  Pharyngeal --  Pharyngeal- Thin Cup --  Pharyngeal --  Pharyngeal- Thin Straw --  Pharyngeal --  Pharyngeal- Puree --  Pharyngeal --  Pharyngeal- Mechanical Soft --  Pharyngeal --  Pharyngeal- Regular --  Pharyngeal --  Pharyngeal- Multi-consistency --  Pharyngeal --  Pharyngeal- Pill --  Pharyngeal --  Pharyngeal Comment --     CHL IP CERVICAL ESOPHAGEAL PHASE 05/28/2017  Cervical Esophageal Phase WFL  Pudding Teaspoon --  Pudding Cup --  Honey Teaspoon --  Honey Cup --  Nectar Teaspoon --  Nectar Cup --  Nectar Straw --  Thin Teaspoon --  Thin Cup --  Thin Straw --  Puree --  Mechanical Soft --  Regular --  Multi-consistency --  Pill --  Cervical Esophageal Comment --    CHL IP GO 05/28/2017  Functional Assessment  Tool Used (None)  Functional Limitations Swallowing  Swallow Current Status [phone]) CI  Swallow Goal Status (Y3888) CI  Swallow Discharge Status [phone]) CI  Motor Speech Current Status [phone]) (None)  Motor Speech Goal Status (O1561) (None)  Motor Speech Goal Status (B3794) (None)  Spoken Language Comprehension Current Status [phone]) (None)  Spoken Language Comprehension Goal Status (Y7092) (None)  Spoken Language Comprehension Discharge Status [phone]) (None)  Spoken Language Expression Current Status [phone]) (None)  Spoken Language Expression Goal Status [phone]) (None)  Spoken Language Expression Discharge Status [phone]) (None)  Attention Current Status (M0375) (None)  Attention Goal Status (O3606) (None)  Attention Discharge Status [phone]) (None)  Memory Current Status  (K3524) (None)  Memory Goal Status (E1859) (None)  Memory Discharge Status (M9311) (None)  Voice Current Status (E1624) (None)  Voice Goal Status (E6950) (None)  Voice Discharge Status (H2257) (None)  Other Speech-Language Pathology Functional Limitation Current Status (D0518) (None)  Other Speech-Language Pathology Functional Limitation Goal Status (Z3582) (None)  Other Speech-Language Pathology Functional Limitation Discharge Status [phone]) (None)       Orinda Kenner, MS, CCC-SLP Watson,Katherine 05/28/2017, 12:55 PM

## 2017-05-28 NOTE — Clinical Social Work Note (Signed)
CSW received consult that patient and her family would like to go to [facility] for short term rehab.  SNF did offer a bed for patient.  Patient to be d/c'ed today to [facility] room 230.  Patient and family agreeable to plans will transport via ems RN to call report [phone].  Evette Cristal, MSW, [place]

## 2017-05-28 NOTE — Discharge Summary (Signed)
[facility] NAME: Debra Cantu    MR#:  [id number]  DATE OF BIRTH:  January 12, 1928  DATE OF ADMISSION:  05/25/2017 ADMITTING PHYSICIAN: Loletha Grayer, MD  DATE OF DISCHARGE: 05/28/2017   PRIMARY CARE PHYSICIAN: Tama High III, MD    ADMISSION DIAGNOSIS:  Subarachnoid hemorrhage ([place]) [I60.9] Subdural hematoma ([place]) [G18.2X9B] Injury of head, initial encounter [S09.90XA] Fall, initial encounter [W19.XXXA]  DISCHARGE DIAGNOSIS:  Active Problems:   Subdural hematoma (HCC)   Head pain   Seizures.  SECONDARY DIAGNOSIS:   Past Medical History:  Diagnosis Date  . Arthritis   . Enlarged heart   . Hypercholesteremia   . Hypertension   . Macular degeneration   . Osteoporosis   . Pulmonary embolism ([place])     HOSPITAL COURSE:   1. Subdural hematoma and subarachnoid hemorrhage from fall.  Repeat CT scan worsening with herniation. Patient is a DNR. Was admitted for comfort measures.   As needed morphine. Empiric Keppra to prevent seizure. As needed nausea medications.  Now have much improvement in her mental condition and slight decrease in the size of hematoma as per repeat CT scan.  keep her DO NOT RESUSCITATE. SLP eval, PT and Neurology eval for further plan.  Started On Diet, PT suggest Rehab placement. D/c today. 2. History of pulmonary embolism on Coumadin. ER physician did give vitamin K   Will not resume anticoagulation due to bleed. 3. History of essential hypertension. Hold medications at this time.   BP is normal.    Resume small dose amlodipine. 4. History of hyperlipidemia.   Lipitor 5. History of hypothyroidism.    Resume Synthroid at this time 6. Seizures   Started on keppra, seen by neurology.  DISCHARGE CONDITIONS:   Stable.  CONSULTS OBTAINED:  Treatment Team:  Meade Maw, MD Alexis Goodell, MD  DRUG ALLERGIES:   Allergies  Allergen Reactions  . Oxycodone      Other reaction(s): Hallucination  . Gabapentin     Other reaction(s): Headache  . Rizatriptan     Other reaction(s): Other (See Comments) sedation  . Sulfa Antibiotics     Other reaction(s): Unknown    DISCHARGE MEDICATIONS:   Current Discharge Medication List    START taking these medications   Details  butalbital-acetaminophen-caffeine (FIORICET, ESGIC) 50-325-40 MG tablet Take 1 tablet by mouth every 6 (six) hours as needed for headache. Qty: 14 tablet, Refills: 0    levETIRAcetam (KEPPRA) 1000 MG tablet Take 1 tablet (1,000 mg total) by mouth 2 (two) times daily. Qty: 60 tablet, Refills: 0      CONTINUE these medications which have NOT CHANGED   Details  amLODipine (NORVASC) 2.5 MG tablet Take 2.5 mg by mouth daily.    atorvastatin (LIPITOR) 80 MG tablet Take 40 mg by mouth daily.     beta carotene 15 MG capsule Take 15 mg by mouth daily.    Cholecalciferol 2000 units TABS Take 2,000 Units by mouth 2 (two) times daily.     glucosamine-chondroitin 500-400 MG tablet Take 1 tablet by mouth 2 (two) times daily.     levothyroxine (SYNTHROID, LEVOTHROID) 88 MCG tablet Take 88 mcg by mouth daily before breakfast.    Multiple Vitamins-Minerals (PRESERVISION AREDS 2 PO) Take 1 tablet by mouth 2 (two) times daily.    venlafaxine (EFFEXOR) 37.5 MG tablet Take 37.5 mg by mouth 2 (two) times daily with a meal.       STOP taking  these medications     telmisartan (MICARDIS) 80 MG tablet      warfarin (COUMADIN) 5 MG tablet          DISCHARGE INSTRUCTIONS:    DIET recommendations  SLP Diet Recommendations Dysphagia 3 (Mech soft) solids;Thin liquid  Liquid Administration via Cup;No straw  Medication Administration Whole meds with puree  Compensations Minimize environmental distractions;Slow rate;Small sips/bites;Lingual sweep for clearance of pocketing;Multiple dry swallows after each bite/sip;Follow solids with liquid  Postural Changes Remain semi-upright after  after feeds/meals (Comment);Seated upright at 90 degrees     If you experience worsening of your admission symptoms, develop shortness of breath, life threatening emergency, suicidal or homicidal thoughts you must seek medical attention immediately by calling 911 or calling your MD immediately  if symptoms less severe.  You Must read complete instructions/literature along with all the possible adverse reactions/side effects for all the Medicines you take and that have been prescribed to you. Take any new Medicines after you have completely understood and accept all the possible adverse reactions/side effects.   Please note  You were cared for by a hospitalist during your hospital stay. If you have any questions about your discharge medications or the care you received while you were in the hospital after you are discharged, you can call the unit and asked to speak with the hospitalist on call if the hospitalist that took care of you is not available. Once you are discharged, your primary care physician will handle any further medical issues. Please note that NO REFILLS for any discharge medications will be authorized once you are discharged, as it is imperative that you return to your primary care physician (or establish a relationship with a primary care physician if you do not have one) for your aftercare needs so that they can reassess your need for medications and monitor your lab values.    Today   CHIEF COMPLAINT:   Chief Complaint  Patient presents with  . Fall    HISTORY OF PRESENT ILLNESS:  Debra Cantu  is a 83 y.o. female presented after a fall.  The patient does take Coumadin at home.  Initial CAT scan did show an acute intracranial hemorrhage combination of anterior and interhemispheric subdural and adjacent subarachnoid hemorrhage.  A couple hours later the patient's mental status worsened and a repeat CT scan was ordered.  This showed interval development of a large right  convexity subdural hematoma measuring 1.8 cm causing significant mass-effect upon the right lateral ventricle with 1.2 mm of midline shift to the left.  Right uncal herniation.  ER physician Dr. Jimmye Norman called the patient's son Debra Cantu at [phone].  They decided to honor the patient's wishes of a DO NOT RESUSCITATE.  The transfer to a tertiary care center where neurosurgical consultation can be obtained was canceled.  Patient will be admitted here without neurosurgical intervention.  Hospitalist services were contacted for evaluation.  The patient is unresponsive to sternal rub.  A friend who lives a couple houses down was in the room.  The patient's son called her at the time that I was in the room and I also spoke with him and confirmed comfort measures.  History obtained from epic records.  VITAL SIGNS:  Blood pressure (!) 151/60, pulse (!) 53, temperature 98.2 F (36.8 C), temperature source Oral, resp. rate 16, height 4\' 11"  (1.499 m), weight 75.8 kg (167 lb), SpO2 96 %.  I/O:   Intake/Output Summary (Last 24 hours) at 05/28/17 1508 Last data filed  at 05/27/17 1945  Gross per 24 hour  Intake              105 ml  Output                0 ml  Net              105 ml    PHYSICAL EXAMINATION:   GENERAL:  83 y.o.-year-old patient lying in the bed with no acute distress.  EYES: Pupils equal, round, reactive to light and accommodation. No scleral icterus. Extraocular muscles intact.  HEENT: Head atraumatic, normocephalic. Oropharynx and nasopharynx clear.  NECK:  Supple, no jugular venous distention. No thyroid enlargement, no tenderness.  LUNGS: Normal breath sounds bilaterally, no wheezing, rales,rhonchi or crepitation. No use of accessory muscles of respiration.  CARDIOVASCULAR: S1, S2 normal. No murmurs, rubs, or gallops.  ABDOMEN: Soft, nontender, nondistended. Bowel sounds present. No organomegaly or mass.  EXTREMITIES: No pedal edema, cyanosis, or clubbing.  NEUROLOGIC: Cranial  nerves II through XII are intact. Muscle strength 4/5 in right side extremities, have 1/5 in left side extremities. Sensation intact. Gait not checked.  PSYCHIATRIC: The patient is alert and oriented x 2.  SKIN: No obvious rash, lesion, or ulcer.   DATA REVIEW:   CBC  Recent Labs Lab 05/25/17 1410  WBC 7.6  HGB 14.4  HCT 44.2  PLT 183    Chemistries   Recent Labs Lab 05/25/17 1410  NA 138  K 4.5  CL 104  CO2 29  GLUCOSE 114*  BUN 13  CREATININE 0.68  CALCIUM 9.9  AST 27  ALT 26  ALKPHOS 84  BILITOT 0.5    Cardiac Enzymes No results for input(s): TROPONINI in the last 168 hours.  Microbiology Results  No results found for this or any previous visit.  RADIOLOGY:  No results found.  EKG:   Orders placed or performed during the hospital encounter of 05/25/17  . ED EKG  . ED EKG      Management plans discussed with the patient, family and they are in agreement.  CODE STATUS:     Code Status Orders        Start     Ordered   05/25/17 1607  Do not attempt resuscitation (DNR)  Continuous    Question Answer Comment  In the event of cardiac or respiratory ARREST Do not call a "code blue"   In the event of cardiac or respiratory ARREST Do not perform Intubation, CPR, defibrillation or ACLS   In the event of cardiac or respiratory ARREST Use medication by any route, position, wound care, and other measures to relive pain and suffering. May use oxygen, suction and manual treatment of airway obstruction as needed for comfort.   Comments nurse may pronounce      05/25/17 1607    Code Status History    Date Active Date Inactive Code Status Order ID Comments User Context   05/25/2017  3:11 PM 05/25/2017  4:07 PM DNR [id number]  [name], MD ED    Advance Directive Documentation     Most Recent Value  Type of Advance Directive  Out of facility DNR (pink MOST or yellow form)  Pre-existing out of facility DNR order (yellow form or pink MOST form)   Yellow form placed in chart (order not valid for inpatient use)  "MOST" Form in Place?  -      TOTAL TIME TAKING CARE OF THIS PATIENT: 35 minutes.  Vaughan Basta M.D on 05/28/2017 at 3:08 PM  Between 7am to 6pm - Pager - [phone]  After 6pm go to [email or web address] - password [email or web address] [facility]  Office  [phone]  CC: Primary care physician; Adin Hector, MD   Note: This dictation was prepared with Dragon dictation along with smaller phrase technology. Any transcriptional errors that result from this process are unintentional.

## 2017-05-28 NOTE — Progress Notes (Signed)
Patient discharged to [facility] per MD order. Report called to [facility] at facility. EMS called for transportation.

## 2017-05-28 NOTE — Clinical Social Work Note (Signed)
Clinical Social Work Assessment  Patient Details  Name: Debra Cantu MRN: [id number] Date of Birth: 11/02/1927  Date of referral:  05/28/17               Reason for consult:  Facility Placement                Permission sought to share information with:  Facility Sport and exercise psychologist, Family Supports Permission granted to share information::  Yes, Verbal Permission Granted  Name::     [place], Font [phone]  Agency::  SNF admissions  Relationship::     Contact Information:     Housing/Transportation Living arrangements for the past 2 months:  [facility]) Source of Information:  Adult Children, Patient Patient Interpreter Needed:  None Criminal Activity/Legal Involvement Pertinent to Current Situation/Hospitalization:  No - Comment as needed Significant Relationships:  Adult Children Lives with:  Self Do you feel safe going back to the place where you live?  No Need for family participation in patient care:  Yes (Comment)  Care giving concerns:  Patient and family feel she needs SNF placement first before returning back to Independent living  Social Worker assessment / plan: Patient is a resident at [facility], patient is alert and oriented x2.  Assessment was completed by speaking with patient's son and patient.  Patient has been at [facility] for several years, and she has also been to the rehab section as well in the past.  Patient's son was familiar with the facility and the process for looking for SNF placement.  Patient's son was explained role of CSW and process for getting patient place at SNF section of [place].  Patient's son did not have any questions or concerns and gave CSW permission to [employer].  Employment status:  Retired Forensic scientist:  Medicare PT Recommendations:  [place] / Referral to community resources:  [place]  Patient/Family's Response to  care:  Patient and family agreeable to going to SNF.  Patient/Family's Understanding of and Emotional Response to Diagnosis, Current Treatment, and Prognosis:  Patient is aware of current treatment plan and progonsis.  Emotional Assessment Appearance:  Appears stated age Attitude/Demeanor/Rapport:    Affect (typically observed):  Appropriate, Stable Orientation:  Oriented to Self, Oriented to Place Alcohol / Substance use:  Not Applicable Psych involvement (Current and /or in the community):  No (Comment)  Discharge Needs  Concerns to be addressed:  Lack of Support Readmission within the last 30 days:  No Current discharge risk:  Lack of support system Barriers to Discharge:  No Barriers Identified   Anell Barr 05/28/2017, 7:33 PM

## 2017-05-28 NOTE — Progress Notes (Signed)
Subjective: Reports being tired.  Had seizures late yesterday afternoon but had none overnight.  Reports able to move her left better today.    Objective: Current vital signs: BP (!) 158/64 (BP Location: Left Arm)   Pulse (!) 57   Temp 98 F (36.7 C) (Oral)   Resp 16   Ht 4\' 11"  (1.499 m)   Wt 75.8 kg (167 lb)   SpO2 96%   BMI 33.73 kg/m  Vital signs in last 24 hours: Temp:  [98 F (36.7 C)-98.7 F (37.1 C)] 98 F (36.7 C) (11/02 0845) Pulse Rate:  [55-62] 57 (11/02 0845) Resp:  [16-20] 16 (11/02 0845) BP: (145-168)/(52-64) 158/64 (11/02 0845) SpO2:  [96 %-100 %] 96 % (11/02 0845)  Intake/Output from previous day: 11/01 0701 - 11/02 0700 In: 225 [P.O.:120; IV Piggyback:105] Out: -  Intake/Output this shift: No intake/output data recorded. Nutritional status: Diet NPO time specified  Neurologic Exam: Mental Status: Lethargic but awakens easily.  Able to follow commands.  Speech fluent.   Cranial Nerves: II: Discs flat bilaterally; Blinks to bilateral confrontation III,IV, VI: ptosis not present, extra-ocular motions intact bilaterally V,VII: mild decrease in the left NLF, facial light touch sensation normal bilaterally VIII: hearing normal bilaterally IX,X: gag reflex present XI: bilateral shoulder shrug XII: midline tongue extension Motor: Able to lift all extremities against gravity.  5/5 in the [place].  5-/5 in the LLE.  5/5 in the RLE Sensory: Pinprick and light touch intact throughout, bilaterally   Lab Results: Basic Metabolic Panel:  Recent Labs Lab 05/25/17 1410  NA 138  K 4.5  CL 104  CO2 29  GLUCOSE 114*  BUN 13  CREATININE 0.68  CALCIUM 9.9    Liver Function Tests:  Recent Labs Lab 05/25/17 1410  AST 27  ALT 26  ALKPHOS 84  BILITOT 0.5  PROT 6.3*  ALBUMIN 3.8   No results for input(s): LIPASE, AMYLASE in the last 168 hours. No results for input(s): AMMONIA in the last 168 hours.  CBC:  Recent Labs Lab 05/25/17 1410  WBC 7.6   NEUTROABS 5.6  HGB 14.4  HCT 44.2  MCV 91.0  PLT 183    Cardiac Enzymes: No results for input(s): CKTOTAL, CKMB, CKMBINDEX, TROPONINI in the last 168 hours.  Lipid Panel: No results for input(s): CHOL, TRIG, HDL, CHOLHDL, VLDL, LDLCALC in the last 168 hours.  CBG: No results for input(s): GLUCAP in the last 168 hours.  Microbiology: No results found for this or any previous visit.  Coagulation Studies:  Recent Labs  05/25/17 1410 05/26/17 0110  LABPROT 22.0* 15.9*  INR 1.94 1.28    Imaging: No results found.  Medications:  I have reviewed the patient's current medications. Scheduled: . amLODipine  2.5 mg Oral Daily  . atorvastatin  40 mg Oral q1800  . levETIRAcetam  1,000 mg Oral BID  . levothyroxine  88 mcg Oral QAC breakfast  . metoCLOPramide (REGLAN) injection  10 mg Intravenous Once  . scopolamine  1 patch Transdermal Q72H  . venlafaxine XR  37.5 mg Oral BID WC    Assessment/Plan: Patient had two further seizures yesterday, late afternoon but had none overnight.  Weakness noted on yesterday likely a Todd's phenomenon.  Improved today.  May possibly be having some lethargy due to [place] but can not rule out the possibility that it may be related to her frequent seizures as well.    Recommendations: 1.  Patient to change to po Keppra 2.  Continue seizure  precautions.     LOS: 3 days   Alexis Goodell, MD Neurology [phone] 05/28/2017  10:10 AM

## 2017-05-31 ENCOUNTER — Telehealth: Payer: Self-pay

## 2017-05-31 ENCOUNTER — Encounter: Payer: Self-pay | Admitting: Internal Medicine

## 2017-05-31 NOTE — Telephone Encounter (Signed)
PLEASE NOTE: All timestamps contained within this report are represented as [place] Standard Time. CONFIDENTIALTY NOTICE: This fax transmission is intended only for the addressee. It contains information that is legally privileged, confidential or otherwise protected from use or disclosure. If you are not the intended recipient, you are strictly prohibited from reviewing, disclosing, copying using or disseminating any of this information or taking any action in reliance on or regarding this information. If you have received this fax in error, please notify us immediately by telephone so that we can arrange for its return to [place]. Phone: [phone], Toll-Free: [phone], Fax: [phone] Page: 1 of 1 Call Id: [id number] [place] Night - Client Nonclinical Telephone Record [place] Night - Client Client Site [place] Physician Viviana Simpler - MD Contact Type Call Who Is Calling Physician / Provider / Hospital Call Type Provider Call Message Only Reason for Call Request to send message to Office Initial Comment melinda w/ [facility]: pt headache pills are not working, needs to meet with dr. before [facility] when he leaves town. Additional Comment pt: Debra Cantu son: Richardson Landry parks his # to call back on: [phone] Call Closed By: Donato Heinz Transaction Date/Time: 05/30/2017 1:20:00 PM (ET)

## 2017-05-31 NOTE — Telephone Encounter (Signed)
I am meeting with him right now at [facility]

## 2017-06-01 DIAGNOSIS — I82409 Acute embolism and thrombosis of unspecified deep veins of unspecified lower extremity: Secondary | ICD-10-CM | POA: Diagnosis not present

## 2017-06-01 DIAGNOSIS — E039 Hypothyroidism, unspecified: Secondary | ICD-10-CM

## 2017-06-01 DIAGNOSIS — I6200 Nontraumatic subdural hemorrhage, unspecified: Secondary | ICD-10-CM | POA: Diagnosis not present

## 2017-06-01 DIAGNOSIS — F445 Conversion disorder with seizures or convulsions: Secondary | ICD-10-CM | POA: Diagnosis not present

## 2017-06-01 DIAGNOSIS — F39 Unspecified mood [affective] disorder: Secondary | ICD-10-CM | POA: Diagnosis not present

## 2017-06-01 DIAGNOSIS — I10 Essential (primary) hypertension: Secondary | ICD-10-CM | POA: Diagnosis not present

## 2017-06-02 DIAGNOSIS — S065X0A Traumatic subdural hemorrhage without loss of consciousness, initial encounter: Secondary | ICD-10-CM | POA: Diagnosis not present

## 2017-06-02 DIAGNOSIS — S066X0A Traumatic subarachnoid hemorrhage without loss of consciousness, initial encounter: Secondary | ICD-10-CM | POA: Diagnosis not present

## 2017-06-03 DIAGNOSIS — F05 Delirium due to known physiological condition: Secondary | ICD-10-CM

## 2017-06-07 ENCOUNTER — Encounter: Payer: Self-pay | Admitting: Intensive Care

## 2017-06-07 ENCOUNTER — Emergency Department: Payer: Medicare Other

## 2017-06-07 ENCOUNTER — Emergency Department
Admission: EM | Admit: 2017-06-07 | Discharge: 2017-06-07 | Disposition: A | Discharge status: Skilled Nursing Facility | Payer: Medicare Other | Attending: Emergency Medicine | Admitting: Emergency Medicine

## 2017-06-07 DIAGNOSIS — I10 Essential (primary) hypertension: Secondary | ICD-10-CM | POA: Diagnosis not present

## 2017-06-07 DIAGNOSIS — Z79899 Other long term (current) drug therapy: Secondary | ICD-10-CM | POA: Diagnosis not present

## 2017-06-07 DIAGNOSIS — E7800 Pure hypercholesterolemia, unspecified: Secondary | ICD-10-CM | POA: Diagnosis not present

## 2017-06-07 DIAGNOSIS — R531 Weakness: Secondary | ICD-10-CM

## 2017-06-07 DIAGNOSIS — Z87891 Personal history of nicotine dependence: Secondary | ICD-10-CM | POA: Insufficient documentation

## 2017-06-07 DIAGNOSIS — Z86718 Personal history of other venous thrombosis and embolism: Secondary | ICD-10-CM | POA: Diagnosis not present

## 2017-06-07 LAB — URINALYSIS, COMPLETE (UACMP) WITH MICROSCOPIC
BILIRUBIN URINE: NEGATIVE
Bacteria, UA: NONE SEEN
GLUCOSE, UA: NEGATIVE mg/dL
Hgb urine dipstick: NEGATIVE
KETONES UR: NEGATIVE mg/dL
LEUKOCYTES UA: NEGATIVE
NITRITE: NEGATIVE
PH: 6 (ref 5.0–8.0)
Protein, ur: NEGATIVE mg/dL
SPECIFIC GRAVITY, URINE: 1.016 (ref 1.005–1.030)
Squamous Epithelial / LPF: NONE SEEN

## 2017-06-07 LAB — BASIC METABOLIC PANEL
Anion gap: 10 (ref 5–15)
BUN: 20 mg/dL (ref 6–20)
CALCIUM: 9.6 mg/dL (ref 8.9–10.3)
CHLORIDE: 101 mmol/L (ref 101–111)
CO2: 27 mmol/L (ref 22–32)
Creatinine, Ser: 0.49 mg/dL (ref 0.44–1.00)
GFR calc Af Amer: >60 mL/min (ref >60)
Glucose, Bld: 118 mg/dL — ABNORMAL HIGH (ref 65–99)
Potassium: 4.1 mmol/L (ref 3.5–5.1)
Sodium: 138 mmol/L (ref 135–145)

## 2017-06-07 LAB — CBC WITH DIFFERENTIAL/PLATELET
Basophils Absolute: 0.1 10*3/uL (ref 0–0.1)
Basophils Relative: 0 %
Eosinophils Absolute: 0.1 10*3/uL (ref 0–0.7)
Eosinophils Relative: 0 %
HEMATOCRIT: 41.4 % (ref 35.0–47.0)
HEMOGLOBIN: 13.5 g/dL (ref 12.0–16.0)
LYMPHS ABS: 0.9 10*3/uL — AB (ref 1.0–3.6)
Lymphocytes Relative: 6 %
MCH: 29.4 pg (ref 26.0–34.0)
MCHC: 32.7 g/dL (ref 32.0–36.0)
MCV: 90 fL (ref 80.0–100.0)
MONO ABS: 1.3 10*3/uL — AB (ref 0.2–0.9)
MONOS PCT: 9 %
NEUTROS ABS: 12.4 10*3/uL — AB (ref 1.4–6.5)
NEUTROS PCT: 85 %
Platelets: 186 10*3/uL (ref 150–440)
RBC: 4.6 MIL/uL (ref 3.80–5.20)
RDW: 14.3 % (ref 11.5–14.5)
WBC: 14.7 10*3/uL — ABNORMAL HIGH (ref 3.6–11.0)

## 2017-06-07 MED ORDER — SODIUM CHLORIDE 0.9 % IV SOLN
1500.0000 mg | Freq: Once | INTRAVENOUS | Status: AC
  Administered 2017-06-07: 1500 mg via INTRAVENOUS
  Filled 2017-06-07: qty 15

## 2017-06-07 MED ORDER — LEVETIRACETAM 1000 MG PO TABS: 1500.0000 mg | ORAL_TABLET | Freq: Two times a day (BID) | ORAL | 0 refills | Status: AC

## 2017-06-07 NOTE — ED Notes (Signed)
Patient's daughter is at bedside, states patient has had difficulty swallowing and is now on thickened liquids. Per Daughter, chest x-ray was normal. Patient's daughter states that the patient's confusion waxes and wanes since the fall. Daughter states that the patient's son is a physician who wanted the patient transferred here for an EEG, CT scan and consult with Dr. Doy Mince to help OT/PT determine care.

## 2017-06-07 NOTE — ED Provider Notes (Signed)
[facility] Emergency Department Provider Note   ____________________________________________   I have reviewed the triage vital signs and the nursing notes.   HISTORY  Chief Complaint Left sided weakness  History limited by: Not Limited   HPI Debra Cantu is a 83 y.o. female who presents to the emergency department today because of concern for worsening left sided weakness.   LOCATION:left upper and lower extremity. DURATION:5 days TIMING: constant SEVERITY: complete lack of strength QUALITY: weakness CONTEXT: patient had a fall with subsequent intracranial hemorrhage, recent discharge to [facility]. On Wednesday had an episode of high blood pressure, since that time has had worsening weakness of the left side MODIFYING FACTORS: none identified ASSOCIATED SYMPTOMS: intermittent confusion.  Per medical record review patient has a history of recent intracranial bleed.   Past Medical History:  Diagnosis Date  . Arthritis   . Enlarged heart   . Hypercholesteremia   . Hypertension   . Macular degeneration   . Osteoporosis   . Pulmonary embolism [facility])     Patient Active Problem List   Diagnosis Date Noted  . Head pain 05/27/2017  . Subdural hematoma ([place]) 05/25/2017  . Costochondritis 07/03/2015  . Chronic diastolic heart failure ([place]) 03/18/2015  . URI (upper respiratory infection) 12/07/2014  . Pulmonary hypertension ([place]) 10/17/2014  . Acute pulmonary embolism ([place]) 10/17/2014  . Obstructive sleep apnea 10/17/2014    Past Surgical History:  Procedure Laterality Date  . CATARACT EXTRACTION  2013  . CHOLECYSTECTOMY  2003  . HERNIA REPAIR  2004  . reconstructive foot surgery  2002  . REPLACEMENT TOTAL KNEE Right   . ruptured disc repair     T4, T5  . VAGINAL HYSTERECTOMY  1978  . VEIN LIGATION Bilateral     Prior to Admission medications   Medication Sig Start Date End Date Taking? Authorizing Provider  amLODipine (NORVASC) 2.5  MG tablet Take 2.5 mg by mouth daily.    [provider]  atorvastatin (LIPITOR) 80 MG tablet Take 40 mg by mouth daily.     [provider]  beta carotene 15 MG capsule Take 15 mg by mouth daily.    [provider]  butalbital-acetaminophen-caffeine (FIORICET, ESGIC) 50-325-40 MG tablet Take 1 tablet by mouth every 6 (six) hours as needed for headache. 05/28/17   Vaughan Basta, MD  Cholecalciferol 2000 units TABS Take 2,000 Units by mouth 2 (two) times daily.     [provider]  glucosamine-chondroitin 500-400 MG tablet Take 1 tablet by mouth 2 (two) times daily.     [provider]  levETIRAcetam (KEPPRA) 1000 MG tablet Take 1 tablet (1,000 mg total) by mouth 2 (two) times daily. 05/28/17   Vaughan Basta, MD  levothyroxine (SYNTHROID, LEVOTHROID) 88 MCG tablet Take 88 mcg by mouth daily before breakfast.    [provider]  Multiple Vitamins-Minerals (PRESERVISION AREDS 2 PO) Take 1 tablet by mouth 2 (two) times daily.    [provider]  venlafaxine (EFFEXOR) 37.5 MG tablet Take 37.5 mg by mouth 2 (two) times daily with a meal.  06/30/16   [provider]    Allergies Oxycodone; Gabapentin; Rizatriptan; and Sulfa antibiotics  Family History  Problem Relation Age of Onset  . Heart disease Brother   . Stroke Father   . Alzheimer's disease Mother   . Cancer Sister        skin    Social History Social History   Tobacco Use  . Smoking status: Former  Smoker    Packs/day: 0.10    Years: 1.00    Pack years: 0.10    Types: Cigarettes    Last attempt to quit: 10/17/1954    Years since quitting: 62.6  . Smokeless tobacco: Never Used  Substance Use Topics  . Alcohol use: No    Alcohol/week: 0.0 oz  . Drug use: No    Review of Systems Constitutional: No fever/chills Eyes: Chronic left eye vision issues. ENT: No sore throat. Cardiovascular: Denies chest pain. Respiratory: Denies shortness of  breath. Gastrointestinal: No abdominal pain.  No nausea, no vomiting.  No diarrhea.   Genitourinary: Negative for dysuria. Musculoskeletal: Negative for back pain. Skin: Negative for rash. Neurological: Positive for left sided weakness.  ____________________________________________   PHYSICAL EXAM:  VITAL SIGNS: ED Triage Vitals  Enc Vitals Group     BP 06/07/17 1153 (!) 145/78     Pulse Rate 06/07/17 1153 93     Resp 06/07/17 1153 18     Temp 06/07/17 1156 98.3 F (36.8 C)     Temp Source 06/07/17 1156 Oral     SpO2 06/07/17 1153 99 %     Weight 06/07/17 1154 167 lb (75.8 kg)     Height 06/07/17 1154 4\' 11"  (1.499 m)    Constitutional: Awake and alert. No acute distress.  Eyes: Conjunctivae are normal.  ENT   Head: Normocephalic and atraumatic.   Nose: No congestion/rhinnorhea.   Mouth/Throat: Mucous membranes are moist.   Neck: No stridor. Hematological/Lymphatic/Immunilogical: No cervical lymphadenopathy. Cardiovascular: Normal rate, regular rhythm.  No murmurs, rubs, or gallops.  Respiratory: Normal respiratory effort without tachypnea nor retractions. Breath sounds are clear and equal bilaterally. No wheezes/rales/rhonchi. Gastrointestinal: Soft and non tender. No rebound. No guarding.  Genitourinary: Deferred Musculoskeletal: Normal range of motion in all extremities. No lower extremity edema. Neurologic:  Normal speech and language. EOMI. PERRL. Face symmetric. Significantly diminished strength in left upper and lower extremities. Sensation grossly intact.  Skin:  Skin is warm, dry and intact. No rash noted. Psychiatric: Mood and affect are normal. Speech and behavior are normal. Patient exhibits appropriate insight and judgment.  ____________________________________________    LABS (pertinent positives/negatives)  UA not consistent with infection BMP glu 118 otherwise wnl CBC wbc 14.7 ____________________________________________   EKG  I,  Nance Pear, attending physician, personally viewed and interpreted this EKG  EKG Time: 1200 Rate: 94 Rhythm: sinus tachycardia with PVC Axis: left axis deviation Intervals: qtc 423 QRS: narrow ST changes: no st elevation Impression: abnormal ekg   ____________________________________________    RADIOLOGY  CT head Stable appearance with expected evolution of subdural hematoma  ____________________________________________   PROCEDURES  Procedures  ____________________________________________   INITIAL IMPRESSION / ASSESSMENT AND PLAN / ED COURSE  Pertinent labs & imaging results that were available during my care of the patient were reviewed by me and considered in my medical decision making (see chart for details).  Patient presented to the emergency department today because of concerns for left sided weakness.  Patient had a recent hospitalization for fall and subsequent subdural hematoma.  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  For the past few days she has been feeling worse.  On exam she does have significant left-sided weakness.  Head CT does not show any acute pathology.  I had a discussion with Dr. Doy Mince who recommended increasing patient's Keppra.  I did discussion with family.  At this  point they feel comfortable with the plan.  I did discuss that MRI would be our best evaluation for any acute stroke, although I would expect the CT scan to show some changes of this did happen a few days ago.  Family at this time however felt comfortable deferring MRI given patient's discomfort with that exam.  Will trial Keppra.  ____________________________________________   FINAL CLINICAL IMPRESSION(S) / ED DIAGNOSES  Final diagnoses:  Left-sided weakness     Note: This dictation was prepared with Dragon dictation. Any transcriptional errors that result from this process are  unintentional     Nance Pear, MD 06/07/17 1539

## 2017-06-07 NOTE — ED Triage Notes (Signed)
Patient arrived from [place] for decreased mental status. Patient was diagnosed with subarachnoid hemmorrhage last week and why she is currently at [facility]. Family requested patient be sent to ER for declined mental status and be evaluated by Dr. Silvio Pate. Patient wears 2L O2 continuously. A&O to self only.

## 2017-06-07 NOTE — ED Notes (Signed)
Hooked patient up to monitor. 

## 2017-06-07 NOTE — ED Notes (Signed)
Patient taken to CT.

## 2017-06-07 NOTE — Discharge Instructions (Signed)
Please seek medical attention for any high fevers, chest pain, shortness of breath, change in behavior, persistent vomiting, bloody stool or any other new or concerning symptoms.  

## 2017-06-10 ENCOUNTER — Encounter: Payer: Self-pay | Admitting: Internal Medicine

## 2017-06-14 DIAGNOSIS — F05 Delirium due to known physiological condition: Secondary | ICD-10-CM | POA: Diagnosis not present

## 2017-06-21 ENCOUNTER — Telehealth: Payer: Self-pay

## 2017-06-21 NOTE — Telephone Encounter (Signed)
PLEASE NOTE: All timestamps contained within this report are represented as [place] Standard Time. CONFIDENTIALTY NOTICE: This fax transmission is intended only for the addressee. It contains information that is legally privileged, confidential or otherwise protected from use or disclosure. If you are not the intended recipient, you are strictly prohibited from reviewing, disclosing, copying using or disseminating any of this information or taking any action in reliance on or regarding this information. If you have received this fax in error, please notify us immediately by telephone so that we can arrange for its return to [place]. Phone: [phone], Toll-Free: [phone], Fax: [phone] Page: 1 of 1 Call Id: [id number] Atalissa Night - Client Nonclinical Telephone Record [place] Night - Client Client Site [place] Physician Viviana Simpler - MD Contact Type Call Who Is Calling Physician / Provider / Hospital Call Type Provider Call [facility] Page Now Reason for Call Request to speak to Physician Initial Comment caller states they are needing to get an order for fluids for dehydration. Additional Comment Patient Name Debra Cantu Patient DOB Jan 10, 2028 Requesting Provider [facility] Physician Number [phone] Facility Name [facility] Paging [facility] Phone DateTime Result/Outcome Message Type Notes Walker Kehr - MD [phone] 06/16/2017 7:10:01 PM Called On Call Provider - Reached Doctor Paged Walker Kehr - MD 06/16/2017 7:10:52 PM Spoke with On Call - General Message Result Call Closed By: Anda Latina Transaction Date/Time: 06/16/2017 6:48:26 PM (ET)

## 2017-06-21 NOTE — Telephone Encounter (Signed)
I passed on condolences to her son via phone call She had gone on hospice and family was all present

## 2017-06-26 DEATH — deceased
